# Patient Record
Sex: Male | Born: 1985 | Race: White | Hispanic: No | Marital: Married | Smoking: Former smoker
Health system: Southern US, Community
[De-identification: ages and names within clinical notes are randomized; demographics above are authoritative.]

## PROBLEM LIST (undated history)

## (undated) DIAGNOSIS — M459 Ankylosing spondylitis of unspecified sites in spine: Secondary | ICD-10-CM

## (undated) HISTORY — DX: Ankylosing spondylitis of unspecified sites in spine: M45.9

## (undated) HISTORY — PX: EYE SURGERY: SHX253

---

## 2011-06-03 ENCOUNTER — Emergency Department (INDEPENDENT_AMBULATORY_CARE_PROVIDER_SITE_OTHER)
Admission: EM | Admit: 2011-06-03 | Discharge: 2011-06-03 | Disposition: A | Discharge status: Home / Self Care | Payer: BC Managed Care – HMO | Source: Home / Self Care | Attending: Emergency Medicine | Admitting: Emergency Medicine

## 2011-06-03 ENCOUNTER — Encounter (HOSPITAL_COMMUNITY): Payer: Self-pay | Admitting: *Deleted

## 2011-06-03 DIAGNOSIS — H119 Unspecified disorder of conjunctiva: Secondary | ICD-10-CM

## 2011-06-03 MED ORDER — TOBRAMYCIN 0.3 % OP SOLN: 1.0000 [drp] | Freq: Four times a day (QID) | OPHTHALMIC | Status: AC

## 2011-06-03 NOTE — ED Provider Notes (Signed)
History     CSN: [id number]  Arrival date & time 06/03/11  4098   First MD Initiated Contact with Patient 06/03/11 [phone]      Chief Complaint  Patient presents with  . Eye Problem    (Consider location/radiation/quality/duration/timing/severity/associated sxs/prior treatment) HPI Comments: Patient presents urgent care with ongoing left eye redness and discomfort describes as "gritty sensation"patient denies that have had any injuries or objects injuring her affecting his high. Some mild decreased hearing and some degree of discomfort feels like he has and in his eye. No changes in vision. Does describe has been having some coexistent upper respiratory symptoms such as mild cough and runny and congested nose. No fevers, no shortness of breath.  Patient is a 26 y.o. male presenting with eye problem. The history is provided by the patient.  Eye Problem  This is a recurrent problem. The current episode started more than 2 days ago. The problem occurs constantly. The problem has not changed since onset.There is pain in the left eye. There was no injury mechanism. The pain is at a severity of 1/10. The pain is mild. There is no history of trauma to the eye. There is no known exposure to pink eye. He does not wear contacts. Associated symptoms include foreign body sensation and eye redness. Pertinent negatives include no numbness, no blurred vision, no decreased vision, no discharge, no photophobia, no nausea, no vomiting, no tingling, no weakness and no itching. He has tried nothing for the symptoms. The treatment provided no relief.    History reviewed. No pertinent past medical history.  Past Surgical History  Procedure Date  . Eye surgery     History reviewed. No pertinent family history.  History  Substance Use Topics  . Smoking status: Current Everyday Smoker  . Smokeless tobacco: Not on file  . Alcohol Use: Yes     socially      Review of Systems  Constitutional: Negative for  fever, chills, activity change and appetite change.  HENT: Positive for congestion and rhinorrhea.   Eyes: Positive for redness and itching. Negative for blurred vision, photophobia, discharge and visual disturbance.  Respiratory: Positive for cough. Negative for shortness of breath.   Gastrointestinal: Negative for nausea and vomiting.  Skin: Negative for itching.  Neurological: Negative for tingling, weakness and numbness.    Allergies  Review of patient's allergies indicates no known allergies.  Home Medications   Current Outpatient Rx  Name Route Sig Dispense Refill  . TOBRAMYCIN SULFATE 0.3 % OP SOLN Left Eye Place 1 drop into the left eye every 6 (six) hours. 5 mL 0    BP 109/63  Pulse 85  Temp(Src) 98 F (36.7 C) (Oral)  Resp 12  SpO2 100%  Physical Exam  Nursing note and vitals reviewed. Constitutional: He appears well-developed and well-nourished. No distress.  HENT:  Head: Normocephalic.  Eyes: EOM and lids are normal. Pupils are equal, round, and reactive to light. No foreign bodies found. Right eye exhibits no discharge. Left eye exhibits no discharge and no exudate. No foreign body present in the left eye. Left conjunctiva is injected. Left conjunctiva has no hemorrhage. Left eye exhibits normal extraocular motion and no nystagmus.  Lymphadenopathy:    He has no cervical adenopathy.  Skin: No rash noted.    ED Course  Procedures (including critical care time)  Labs Reviewed - No data to display No results found.   1. Conjunctival disease       MDM  No visual changes with moderate conjunctival hyperemia with 3 days. Patient denies any injury or brain objects affecting his left eye. No nausea, no headache no further symptoms. Patient has also been expressing some upper respiratory symptoms such as nasal congestion and mild cough the course of the week prior to left conjunctiva red        Jimmie Molly, MD 06/03/11 1325

## 2011-06-03 NOTE — ED Notes (Signed)
Pt  Reports  Pain /  Irritation         Of left  Eye  symptoms  X 3  Days  - pt  Reports  Sensation of  Something in the  Eye  But  denys  Any specefic  Occurrence        He  Is  Awake  As  Well as  Alert and  Oriented his  Skin is  Warm  /  Dry  He  denys any  Other  Symptoms

## 2011-06-03 NOTE — Discharge Instructions (Signed)
As. discuss should note marked improvement after 42 10/08/2010 return if no improvement or worsening or different symptoms

## 2011-06-04 ENCOUNTER — Encounter (HOSPITAL_COMMUNITY): Payer: Self-pay

## 2011-06-04 ENCOUNTER — Emergency Department (INDEPENDENT_AMBULATORY_CARE_PROVIDER_SITE_OTHER)
Admission: EM | Admit: 2011-06-04 | Discharge: 2011-06-04 | Disposition: A | Discharge status: Home / Self Care | Payer: BC Managed Care – HMO | Source: Home / Self Care | Attending: Family Medicine | Admitting: Family Medicine

## 2011-06-04 DIAGNOSIS — H109 Unspecified conjunctivitis: Secondary | ICD-10-CM

## 2011-06-04 MED ORDER — PREDNISOLONE ACETATE 0.12 % OP SUSP: 1.0000 [drp] | Freq: Four times a day (QID) | OPHTHALMIC | Status: AC

## 2011-06-04 MED ORDER — TETRACAINE HCL 0.5 % OP SOLN
OPHTHALMIC | Status: AC
  Filled 2011-06-04: qty 2

## 2011-06-04 NOTE — Discharge Instructions (Signed)
Use eye drops as prescribed and see eye doctor on fri if further problems.

## 2011-06-04 NOTE — ED Notes (Signed)
Denies trauma; c/o feels worse, tears MUCH worse when lays down

## 2011-06-04 NOTE — ED Provider Notes (Signed)
History     CSN: [id number]  Arrival date & time 06/04/11  1120   First MD Initiated Contact with Patient 06/04/11 1220      Chief Complaint  Patient presents with  . Eye Problem    (Consider location/radiation/quality/duration/timing/severity/associated sxs/prior treatment) Patient is a 26 y.o. male presenting with eye problem. The history is provided by the patient.  Eye Problem  This is a new problem. The current episode started more than 2 days ago (seen yest , sx seem to be worse with gtts given.). The problem has been gradually worsening. There is pain in the left eye. There was no injury mechanism (pt feels may have been from working in dusty barn on sat). The pain is mild. There is no history of trauma to the eye. There is no known exposure to pink eye. Associated symptoms include eye redness. Pertinent negatives include no decreased vision and no double vision.    History reviewed. No pertinent past medical history.  Past Surgical History  Procedure Date  . Eye surgery     History reviewed. No pertinent family history.  History  Substance Use Topics  . Smoking status: Current Everyday Smoker  . Smokeless tobacco: Not on file  . Alcohol Use: Yes     socially      Review of Systems  Constitutional: Negative.   Eyes: Positive for redness. Negative for double vision.    Allergies  Review of patient's allergies indicates no known allergies.  Home Medications   Current Outpatient Rx  Name Route Sig Dispense Refill  . PREDNISOLONE ACETATE 0.12 % OP SUSP Left Eye Place 1 drop into the left eye 4 (four) times daily. 5 mL 0  . TOBRAMYCIN SULFATE 0.3 % OP SOLN Left Eye Place 1 drop into the left eye every 6 (six) hours. 5 mL 0    BP 113/73  Pulse 58  Temp(Src) 97.9 F (36.6 C) (Oral)  Resp 17  SpO2 98%  Physical Exam  Nursing note and vitals reviewed. Constitutional: He is oriented to person, place, and time. He appears well-developed and well-nourished.    HENT:  Head: Normocephalic.  Eyes: EOM are normal. Pupils are equal, round, and reactive to light. Right eye exhibits no discharge. Left eye exhibits no discharge.       Mild conjunctival hyperemia on left, no fb or corneal abrasion, lids everted.  Neurological: He is alert and oriented to person, place, and time.  Skin: Skin is warm and dry.    ED Course  Procedures (including critical care time)  Labs Reviewed - No data to display No results found.   1. Conjunctivitis of left eye       MDM  fluroscein stain neg, slit lamp exam neg for fb or abrasion, eyelids everted wiped and eye irrigated, bacitracin applied.        Linna Hoff, MD 06/04/11 1400

## 2011-06-23 ENCOUNTER — Emergency Department (HOSPITAL_COMMUNITY)
Admission: EM | Admit: 2011-06-23 | Discharge: 2011-06-23 | Disposition: A | Discharge status: Home / Self Care | Payer: BC Managed Care – HMO | Source: Home / Self Care | Attending: Emergency Medicine | Admitting: Emergency Medicine

## 2011-06-23 ENCOUNTER — Encounter (HOSPITAL_COMMUNITY): Payer: Self-pay

## 2011-06-23 DIAGNOSIS — H11432 Conjunctival hyperemia, left eye: Secondary | ICD-10-CM

## 2011-06-23 DIAGNOSIS — H11439 Conjunctival hyperemia, unspecified eye: Secondary | ICD-10-CM

## 2011-06-23 NOTE — ED Notes (Signed)
C/o redness and irritation to lt eye.  States sx started 06/07/11 and he has been here x 2 for the same..  Reports sx are getting worse and vision is blurry in lt eye.

## 2011-06-23 NOTE — Discharge Instructions (Signed)
The ophthalmologist will see you in my eye 10:45
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
## 2014-07-07 ENCOUNTER — Ambulatory Visit
Admission: RE | Admit: 2014-07-07 | Discharge: 2014-07-07 | Disposition: A | Discharge status: Home / Self Care | Payer: PRIVATE HEALTH INSURANCE | Source: Ambulatory Visit | Attending: Family Medicine | Admitting: Family Medicine

## 2014-07-07 ENCOUNTER — Other Ambulatory Visit: Payer: Self-pay | Admitting: Family Medicine

## 2014-07-07 DIAGNOSIS — I861 Scrotal varices: Secondary | ICD-10-CM | POA: Insufficient documentation

## 2014-07-07 DIAGNOSIS — N509 Disorder of male genital organs, unspecified: Principal | ICD-10-CM

## 2014-07-07 DIAGNOSIS — N50819 Testicular pain, unspecified: Secondary | ICD-10-CM

## 2014-07-07 DIAGNOSIS — N508 Other specified disorders of male genital organs: Secondary | ICD-10-CM | POA: Diagnosis present

## 2014-07-07 DIAGNOSIS — N452 Orchitis: Secondary | ICD-10-CM | POA: Diagnosis not present

## 2014-08-09 ENCOUNTER — Ambulatory Visit: Payer: PRIVATE HEALTH INSURANCE

## 2014-08-09 ENCOUNTER — Other Ambulatory Visit: Payer: Self-pay | Admitting: Family Medicine

## 2014-08-09 ENCOUNTER — Other Ambulatory Visit: Payer: Self-pay

## 2014-08-09 DIAGNOSIS — N509 Disorder of male genital organs, unspecified: Principal | ICD-10-CM

## 2014-08-09 DIAGNOSIS — N50819 Testicular pain, unspecified: Secondary | ICD-10-CM

## 2016-05-01 IMAGING — US US ART/VEN ABD/PELV/SCROTUM DOPPLER LTD
1 series · 13 of 25 positions shown · non-contrast
Comparison: None.

CLINICAL DATA: Right scrotal region pain and tenderness

EXAM:
SCROTAL ULTRASOUND
DOPPLER ULTRASOUND OF THE TESTICLES
TECHNIQUE: Complete ultrasound examination of the testicles, epididymis, and
other scrotal structures was performed. Color and spectral Doppler
ultrasound were also utilized to evaluate blood flow to the
testicles.

[Series 1: us art/ven abd/pelv/scrotum doppler ltd · 0.08mm/px · 13 of 105 slices shown]
[im 1/105]
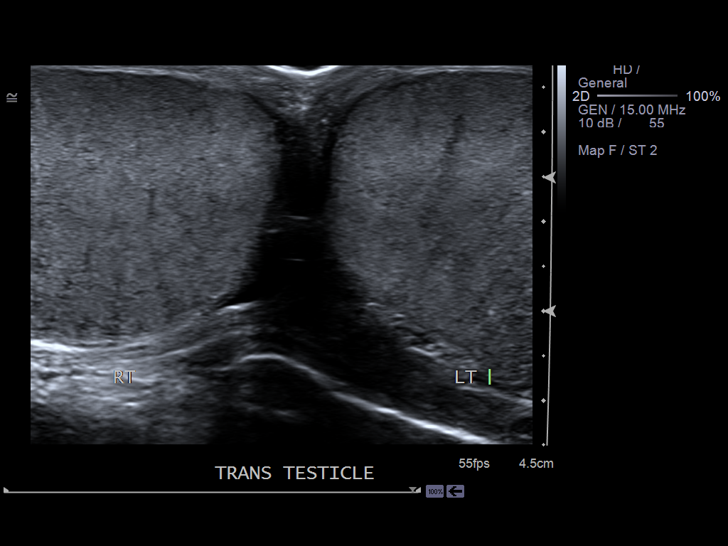
[im 9/105]
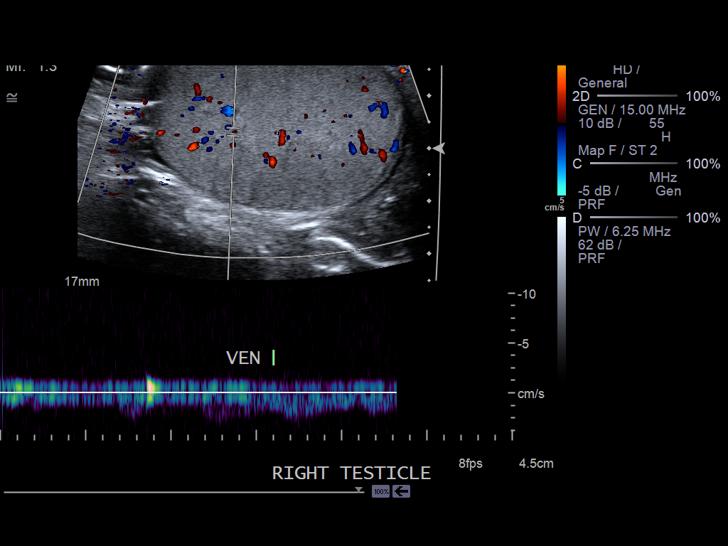
[im 18/105]
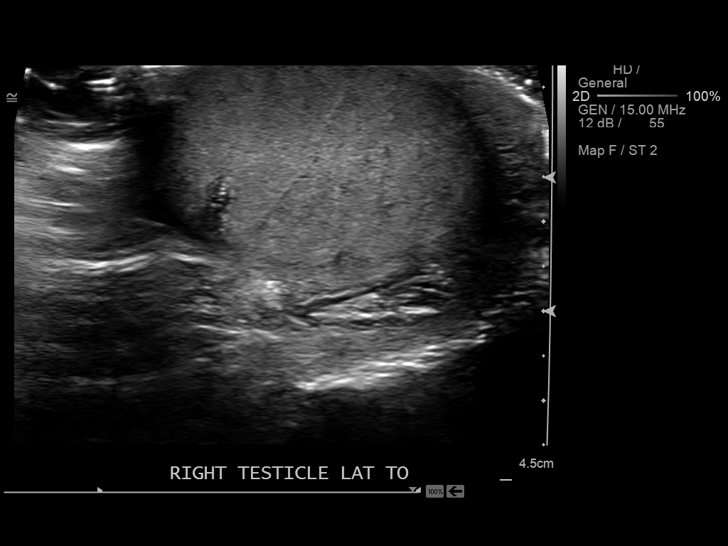
[im 27/105]
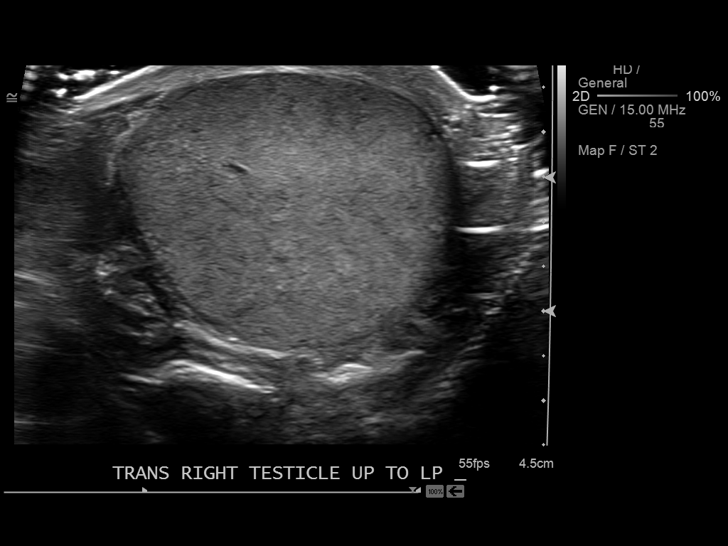
[im 35/105]
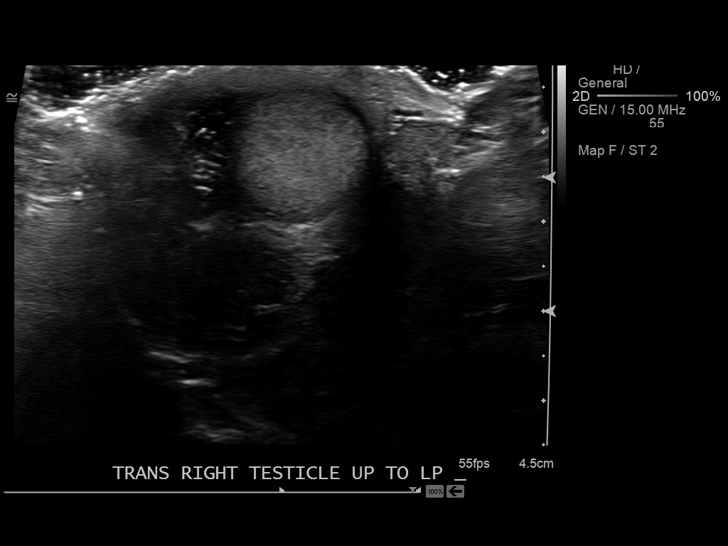
[im 44/105]
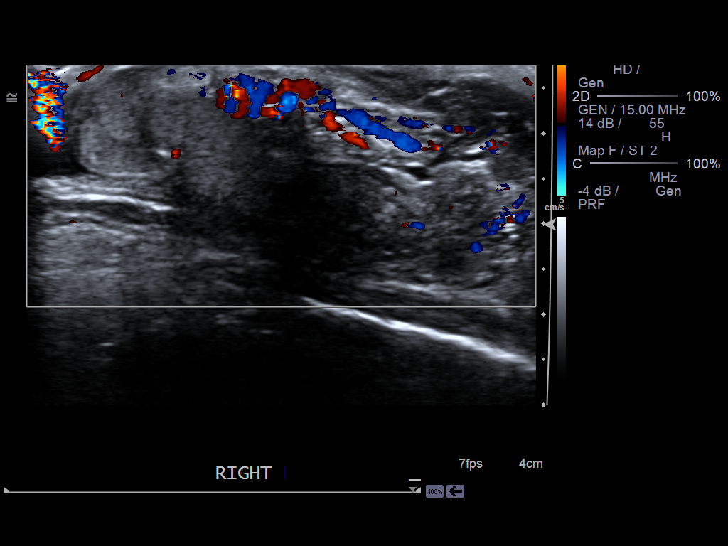
[im 53/105]
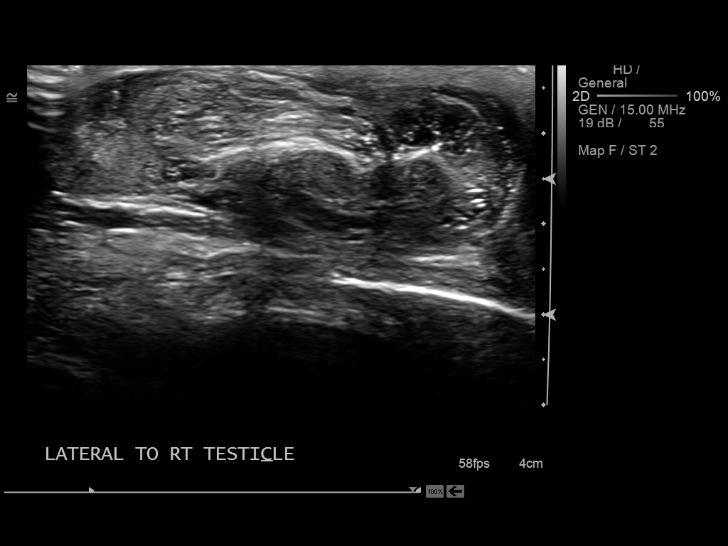
[im 61/105]
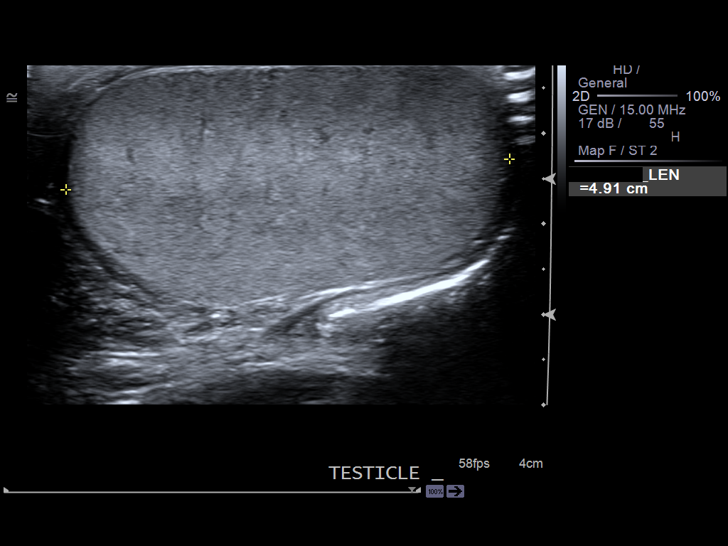
[im 70/105]
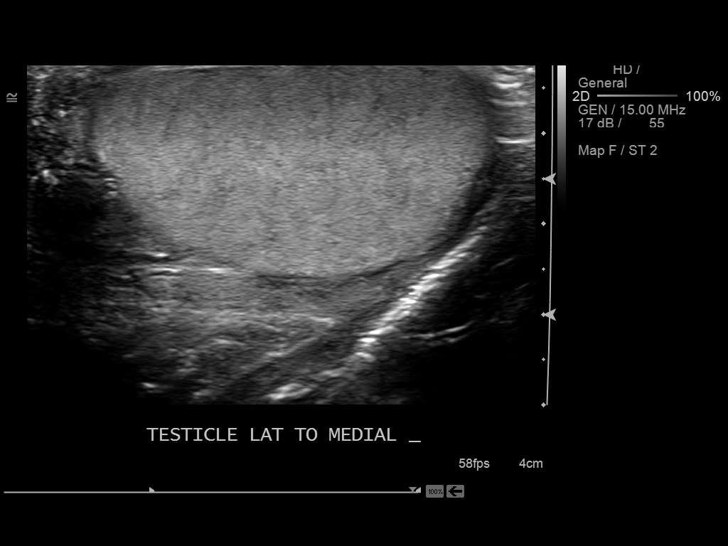
[im 79/105]
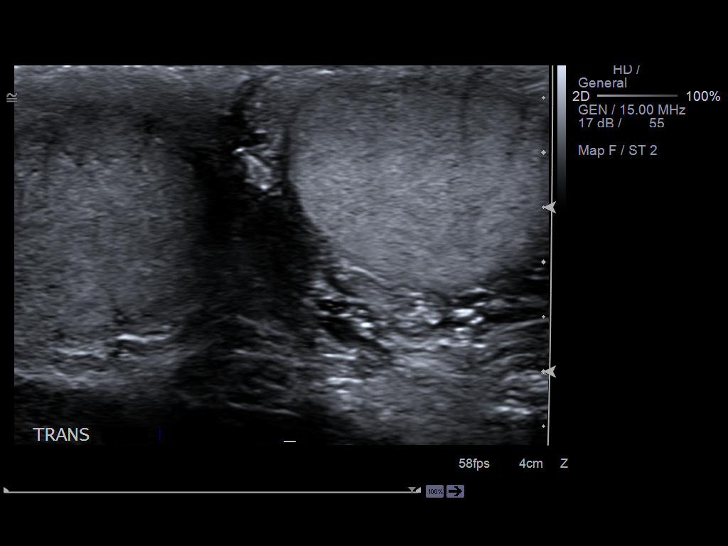
[im 87/105]
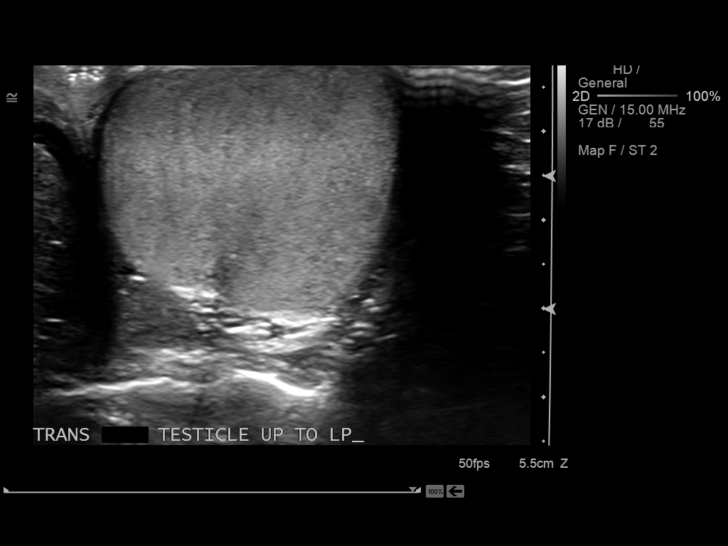
[im 96/105]
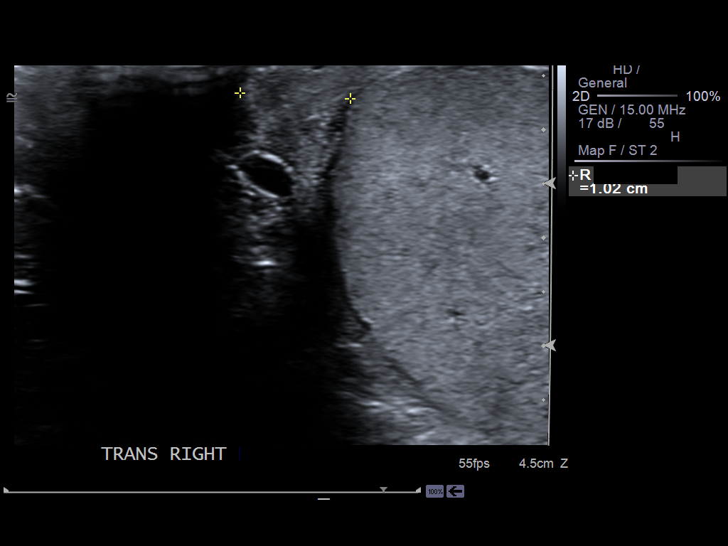
[im 105/105]
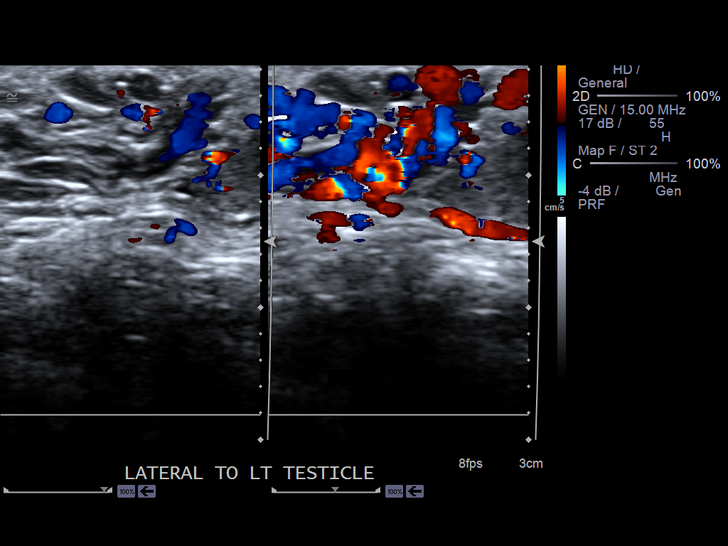

[13 of 25 positions shown; findings below may reference images not displayed]

FINDINGS: Right testicle

Measurements: 4.9 x 3.1 x 3.7. No mass or microlithiasis visualized.

Left testicle

Measurements: 4.9 x 2.9 x 3.3 cm. No mass or microlithiasis
visualized.

Right epididymis: The right epididymis is enlarged and mildly
edematous. There is increased vascularity in the right epididymis,
primarily in the head and body regions.

Left epididymis: Normal in size and contour. No hypervascularity.
Several 2-3 mm cysts are noted in the head of the epididymis on the
left.

Hydrocele:  None visualized.

Varicocele: Small bilateral varicoceles are noted with Valsalva
maneuver.

Pulsed Doppler interrogation of both testes demonstrates normal low
resistance arterial and venous waveforms bilaterally. Peak systolic
velocity in the left testis is 7 cm/sec. Peak systolic velocity in
the right testis is 8 cm/sec.

No scrotal abscess or wall thickening seen on either side.
IMPRESSION: Right-sided orchitis.

No intratesticular or extratesticular mass. No testicular torsion on
either side.

Small varicoceles bilaterally.

Several tiny epididymal head cysts on the left.

## 2016-06-16 ENCOUNTER — Ambulatory Visit: Payer: Self-pay | Admitting: Physician Assistant

## 2016-06-16 ENCOUNTER — Ambulatory Visit
Admission: RE | Admit: 2016-06-16 | Discharge: 2016-06-16 | Disposition: A | Discharge status: Home / Self Care | Payer: Managed Care, Other (non HMO) | Source: Ambulatory Visit | Attending: Physician Assistant | Admitting: Physician Assistant

## 2016-06-16 ENCOUNTER — Encounter: Payer: Self-pay | Admitting: Physician Assistant

## 2016-06-16 VITALS — BP 130/79 | HR 76 | Temp 97.7°F

## 2016-06-16 DIAGNOSIS — R935 Abnormal findings on diagnostic imaging of other abdominal regions, including retroperitoneum: Secondary | ICD-10-CM | POA: Insufficient documentation

## 2016-06-16 DIAGNOSIS — S3600XA Unspecified injury of spleen, initial encounter: Secondary | ICD-10-CM

## 2016-06-16 DIAGNOSIS — R1012 Left upper quadrant pain: Secondary | ICD-10-CM

## 2016-06-16 DIAGNOSIS — S2232XA Fracture of one rib, left side, initial encounter for closed fracture: Secondary | ICD-10-CM

## 2016-06-16 MED ORDER — TRAMADOL HCL 50 MG PO TABS: 50.0000 mg | ORAL_TABLET | Freq: Three times a day (TID) | ORAL | 0 refills | Status: DC | PRN

## 2016-06-16 NOTE — Addendum Note (Signed)
Addended by: Faythe Ghee on: 06/16/2016 11:41 AM   Modules accepted: Orders
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
## 2016-06-16 NOTE — Patient Instructions (Addendum)
Blunt Abdominal Trauma  Blunt abdominal trauma is a type of injury that involves damage to the abdominal wall or to abdominal organs, such as the liver or spleen. The damage can involve bruising, tearing, or a rupture. This type of injury does not involve a puncture of the skin.  Blunt abdominal trauma can range from mild to severe. In some cases it can lead to a severe abdominal inflammation (peritonitis), severe bleeding, and a dangerous drop in blood pressure.  What are the causes?  This injury is caused by a hard, direct hit to the abdomen. It can happen after:  · A motor vehicle accident.  · Being kicked or punched in the abdomen.  · Falling from a significant height.    What increases the risk?  This injury is more likely to happen in people who:  · Play contact sports.  · Work in a job in which falls or injuries are more likely, such as in construction.    What are the signs or symptoms?  The main symptom of this condition is pain in the abdomen. Other symptoms depend on the type and location of the injury. They can include:  · Abdominal pain that spreads to the the back or shoulder.  · Bruising.  · Swelling.  · Pain when pressing on the abdomen.  · Blood in the urine.  · Weakness.  · Confusion.  · Loss of consciousness.  · Pale, dusky, cool, or sweaty skin.  · Vomiting blood.  · Bloody stool or bleeding from the rectum.  · Trouble breathing.    Symptoms of this injury can develop suddenly or slowly.  How is this diagnosed?  This injury is diagnosed based on your symptoms and a physical exam. You may also have tests, including:  · Blood tests.  · Urine tests.  · Imaging tests, such as:  ? A CT scan and ultrasound of your abdomen.  ? X-rays of your chest and abdomen.  · A test in which a tube is used to flush your abdomen with fluid and check for blood (diagnostic peritoneal lavage).    How is this treated?  Treatment for this injury depends on its type and severity. Treatment options include:  · Observation.  If the injury is mild, this may be the only treatment needed.  · Support of your blood pressure and breathing.  · Getting blood, fluids, or medicine through an IV tube.  · Antibiotic medicine.  · Insertion of tubes into the stomach or bladder.  · A blood transfusion.  · A procedure to stop bleeding. This involves putting a long, thin tube (catheter) into one of your blood vessels (angiographic embolization).  · Surgery to open up your abdomen and control bleeding or repair damage (laparotomy). This may be done if tests suggest that you have peritonitis or bleeding that cannot be controlled with angiographic embolization.    Follow these instructions at home:  · Take medicines only as directed by your health care provider.  · If you were prescribed an antibiotic medicine, finish all of it even if you start to feel better.  · Follow your health care provider's instructions about diet and activity restrictions.  · Keep all follow-up visits as directed by your health care provider. This is important.  Contact a health care provider if:  · You continue to have abdominal pain.  · Your symptoms return.  · You develop new symptoms.  · You have blood in your urine or your bowel   movements.  Get help right away if:  · You vomit blood.  · You have heavy bleeding from your rectum.  · You have very bad abdominal pain.  · You have trouble breathing.  · You have chest pain.  · You have a fever.  · You have dizziness.  · You pass out.  This information is not intended to replace advice given to you by your health care provider. Make sure you discuss any questions you have with your health care provider.  Document Released: 03/27/2004 Document Revised: 06/28/2015 Document Reviewed: 02/08/2014  Elsevier Interactive Patient Education © 2017 [employer]

## 2016-07-21 ENCOUNTER — Ambulatory Visit: Payer: Self-pay | Admitting: Physician Assistant

## 2016-07-21 ENCOUNTER — Encounter: Payer: Self-pay | Admitting: Physician Assistant

## 2016-07-21 VITALS — BP 120/70 | HR 63 | Temp 98.5°F | Ht 71.0 in | Wt 149.0 lb

## 2016-07-21 DIAGNOSIS — Z0189 Encounter for other specified special examinations: Secondary | ICD-10-CM

## 2016-07-21 DIAGNOSIS — Z0000 Encounter for general adult medical examination without abnormal findings: Secondary | ICD-10-CM

## 2016-07-21 DIAGNOSIS — Z008 Encounter for other general examination: Secondary | ICD-10-CM

## 2016-07-21 NOTE — Progress Notes (Signed)
S: pt here for wellness physical and biometrics for insurance purposes, no complaints ros neg. PMH: ankylosing spondylitis,    Social: former smoker, +dips snuff, + etoh, no drugs Fam: AAA, MS, Guillan-Barre syndrome, diabetes and htn in elderly family members  O: vitals wnl, nad, ENT wnl, neck supple no lymph, lungs c t a, cv rrr, abd soft nontender bs normal all 4 quads  A: wellness, biometric physical  P: labs, f/u prn, discussed tobacco use and etoh use

## 2016-07-22 LAB — CMP12+LP+TP+TSH+6AC+CBC/D/PLT
A/G RATIO: 1.8 (ref 1.2–2.2)
ALK PHOS: 61 IU/L (ref 39–117)
ALT: 14 IU/L (ref 0–44)
AST: 20 IU/L (ref 0–40)
Albumin: 4.8 g/dL (ref 3.5–5.5)
BASOS ABS: 0 10*3/uL (ref 0.0–0.2)
BASOS: 1 %
BILIRUBIN TOTAL: 0.3 mg/dL (ref 0.0–1.2)
BUN / CREAT RATIO: 10 (ref 9–20)
BUN: 9 mg/dL (ref 6–20)
CHLORIDE: 100 mmol/L (ref 96–106)
Calcium: 9.5 mg/dL (ref 8.7–10.2)
Chol/HDL Ratio: 3.6 ratio (ref 0.0–5.0)
Cholesterol, Total: 167 mg/dL (ref 100–199)
Creatinine, Ser: 0.86 mg/dL (ref 0.76–1.27)
EOS (ABSOLUTE): 0.3 10*3/uL (ref 0.0–0.4)
EOS: 7 %
Estimated CHD Risk: 0.6 times avg. (ref 0.0–1.0)
Free Thyroxine Index: 1.8 (ref 1.2–4.9)
GFR calc non Af Amer: 118 mL/min/{1.73_m2} (ref >59)
GFR, EST AFRICAN AMERICAN: 136 mL/min/{1.73_m2} (ref >59)
GGT: 19 IU/L (ref 0–65)
Globulin, Total: 2.6 g/dL (ref 1.5–4.5)
Glucose: 83 mg/dL (ref 65–99)
HDL: 47 mg/dL (ref >39)
HEMATOCRIT: 39.3 % (ref 37.5–51.0)
HEMOGLOBIN: 13 g/dL (ref 13.0–17.7)
IMMATURE GRANS (ABS): 0 10*3/uL (ref 0.0–0.1)
IMMATURE GRANULOCYTES: 0 %
IRON: 48 ug/dL (ref 38–169)
LDH: 205 IU/L (ref 121–224)
LDL CALC: 103 mg/dL — AB (ref 0–99)
Lymphocytes Absolute: 1.6 10*3/uL (ref 0.7–3.1)
Lymphs: 35 %
MCH: 28.5 pg (ref 26.6–33.0)
MCHC: 33.1 g/dL (ref 31.5–35.7)
MCV: 86 fL (ref 79–97)
MONOCYTES: 10 %
Monocytes Absolute: 0.4 10*3/uL (ref 0.1–0.9)
NEUTROS PCT: 47 %
Neutrophils Absolute: 2.2 10*3/uL (ref 1.4–7.0)
Phosphorus: 3.1 mg/dL (ref 2.5–4.5)
Platelets: 281 10*3/uL (ref 150–379)
Potassium: 4.3 mmol/L (ref 3.5–5.2)
RBC: 4.56 x10E6/uL (ref 4.14–5.80)
RDW: 13.5 % (ref 12.3–15.4)
Sodium: 141 mmol/L (ref 134–144)
T3 UPTAKE RATIO: 28 % (ref 24–39)
T4, Total: 6.6 ug/dL (ref 4.5–12.0)
TOTAL PROTEIN: 7.4 g/dL (ref 6.0–8.5)
TSH: 1.77 u[IU]/mL (ref 0.450–4.500)
Triglycerides: 83 mg/dL (ref 0–149)
Uric Acid: 5.4 mg/dL (ref 3.7–8.6)
VLDL CHOLESTEROL CAL: 17 mg/dL (ref 5–40)
WBC: 4.5 10*3/uL (ref 3.4–10.8)

## 2016-07-22 LAB — HEPATITIS C ANTIBODY (REFLEX): HCV Ab: <0.1 s/co ratio (ref 0.0–0.9)

## 2016-07-22 LAB — HIV ANTIBODY (ROUTINE TESTING W REFLEX): HIV Screen 4th Generation wRfx: NONREACTIVE

## 2016-07-22 LAB — HCV COMMENT:

## 2016-07-22 LAB — VITAMIN D 25 HYDROXY (VIT D DEFICIENCY, FRACTURES): Vit D, 25-Hydroxy: 35.4 ng/mL (ref 30.0–100.0)

## 2017-04-23 ENCOUNTER — Encounter: Payer: Self-pay | Admitting: Family Medicine

## 2017-04-23 ENCOUNTER — Ambulatory Visit: Payer: Self-pay | Admitting: Family Medicine

## 2017-04-23 VITALS — BP 112/76 | HR 76 | Temp 97.7°F | Resp 18 | Ht 71.0 in | Wt 155.0 lb

## 2017-04-23 DIAGNOSIS — Z299 Encounter for prophylactic measures, unspecified: Secondary | ICD-10-CM

## 2017-04-23 NOTE — Progress Notes (Signed)
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  He denies any complaints at this time and reports that his only medical history is ankylosing spondylitis, which is well controlled with Humira, sees Rheumatology regularly. Family history includes: AAA, MS, Guillan-Barre syndrome, diabetes and htn in elderly family members.  No functional limitations identified.  ROS Constitutional: No weight change, no fever, no chills, no fatigue, no malaise.  Musculoskeletal: No arthralgias, myalgias, joint swelling, joint stiffness, back pain, neck pain.  ROS otherwise negative.      Objective:   Physical Exam General: Awake, alert and oriented. No acute distress. Well developed, hydrated and nourished. Appears stated age.  HEENT: Supple neck without adenopathy. Sclera is non-icteric. The ear canal is clear without discharge. The tympanic membrane is normal in appearance with normal landmarks and cone of light. Nasal mucosa is pink and moist. Oral mucosa is pink and moist with good dentition. The pharynx is normal in appearance without tonsillar swelling or exudates.  Skin: Skin in warm, dry and intact without rashes or lesions. Appropriate color for ethnicity. Nailbeds pink with no cyanosis or clubbing. Cardiac:Heart rate and rhythm are normal. No murmurs, gallops, or rubs are auscultated. S1 and S2 are heard and are of normal intensity.  Respiratory: The chest wall is symmetric and without deformity. No signs of respiratory distress. Lung sounds are clear in all lobes bilaterally without rales, ronchi, or wheezes.  Abdominal: Abdomen is soft, symmetric, and non-tender without distention.  Umbilicus is midline without herniation. No masses, hepatomegaly, or splenomegaly are noted.  Spine: Neck and back are without deformity. Curvature of the cervical, thoracic, and lumbar spine are  within normal limits. Bony features of the shoulders and hips are of equal height bilaterally. Posture is upright, gait is smooth, steady, and within normal limits.  No tenderness noted on palpation of the spinous processes. Spinous processes are midline. Cervical, thoracic, and lumbar paraspinal muscles are not tender and are without spasm. No discomfort is noted with flexion, extension, and side-to-side rotation of the cervical spine, full range of motion is noted. Full range of motion including flexion, extension, and side-to-side rotation of the thoracic and lumbar spine are noted and without discomfort. Grip strength is normal bilaterally. Dorsi/plantar flexion is normal bilaterally.  Extremities: Upper and lower extremities are without tenderness or deformity. No swelling or erythema. Full range of motion is noted to all major joints. Steady gait noted.  Neurological: The patient is awake, alert and oriented to person, place, and time with normal speech. Memory is normal and thought process is intact. No gait abnormalities are appreciated.  Psychiatric: Appropriate mood and affect.  Assessment Unrestricted duty granted  Plan Labs pending. Continue regular care with rheumatology and primary care physician.

## 2017-04-24 LAB — CMP12+LP+TP+TSH+6AC+PSA+CBC…
ALBUMIN: 4.7 g/dL (ref 3.5–5.5)
ALT: 18 IU/L (ref 0–44)
AST: 23 IU/L (ref 0–40)
Albumin/Globulin Ratio: 1.7 (ref 1.2–2.2)
Alkaline Phosphatase: 74 IU/L (ref 39–117)
BASOS: 1 %
BUN/Creatinine Ratio: 10 (ref 9–20)
BUN: 11 mg/dL (ref 6–20)
Basophils Absolute: 0.1 10*3/uL (ref 0.0–0.2)
Bilirubin Total: 0.4 mg/dL (ref 0.0–1.2)
CHOL/HDL RATIO: 3.8 ratio (ref 0.0–5.0)
CHOLESTEROL TOTAL: 146 mg/dL (ref 100–199)
Calcium: 9.5 mg/dL (ref 8.7–10.2)
Chloride: 101 mmol/L (ref 96–106)
Creatinine, Ser: 1.05 mg/dL (ref 0.76–1.27)
EOS (ABSOLUTE): 0.4 10*3/uL (ref 0.0–0.4)
Eos: 8 %
Estimated CHD Risk: 0.7 times avg. (ref 0.0–1.0)
Free Thyroxine Index: 1.5 (ref 1.2–4.9)
GFR calc Af Amer: 110 mL/min/{1.73_m2} (ref >59)
GFR calc non Af Amer: 95 mL/min/{1.73_m2} (ref >59)
GGT: 15 IU/L (ref 0–65)
GLOBULIN, TOTAL: 2.7 g/dL (ref 1.5–4.5)
Glucose: 92 mg/dL (ref 65–99)
HDL: 38 mg/dL — ABNORMAL LOW (ref >39)
HEMATOCRIT: 37.6 % (ref 37.5–51.0)
Hemoglobin: 12.8 g/dL — ABNORMAL LOW (ref 13.0–17.7)
IMMATURE GRANULOCYTES: 0 %
IRON: 96 ug/dL (ref 38–169)
Immature Grans (Abs): 0 10*3/uL (ref 0.0–0.1)
LDH: 132 IU/L (ref 121–224)
LDL Calculated: 97 mg/dL (ref 0–99)
LYMPHS ABS: 1.6 10*3/uL (ref 0.7–3.1)
LYMPHS: 33 %
MCH: 29 pg (ref 26.6–33.0)
MCHC: 34 g/dL (ref 31.5–35.7)
MCV: 85 fL (ref 79–97)
MONOS ABS: 0.4 10*3/uL (ref 0.1–0.9)
Monocytes: 9 %
Neutrophils Absolute: 2.3 10*3/uL (ref 1.4–7.0)
Neutrophils: 49 %
PHOSPHORUS: 3.3 mg/dL (ref 2.5–4.5)
POTASSIUM: 4.5 mmol/L (ref 3.5–5.2)
PROSTATE SPECIFIC AG, SERUM: 0.6 ng/mL (ref 0.0–4.0)
Platelets: 263 10*3/uL (ref 150–379)
RBC: 4.42 x10E6/uL (ref 4.14–5.80)
RDW: 13.5 % (ref 12.3–15.4)
SODIUM: 140 mmol/L (ref 134–144)
T3 UPTAKE RATIO: 25 % (ref 24–39)
T4 TOTAL: 6 ug/dL (ref 4.5–12.0)
TOTAL PROTEIN: 7.4 g/dL (ref 6.0–8.5)
TRIGLYCERIDES: 56 mg/dL (ref 0–149)
TSH: 1.24 u[IU]/mL (ref 0.450–4.500)
Uric Acid: 6.3 mg/dL (ref 3.7–8.6)
VLDL Cholesterol Cal: 11 mg/dL (ref 5–40)
WBC: 4.7 10*3/uL (ref 3.4–10.8)
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
## 2021-06-03 ENCOUNTER — Emergency Department: Payer: BC Managed Care – PPO | Admitting: Anesthesiology

## 2021-06-03 ENCOUNTER — Ambulatory Visit
Admission: EM | Admit: 2021-06-03 | Discharge: 2021-06-03 | Disposition: A | Discharge status: Home / Self Care | Payer: BC Managed Care – PPO | Attending: Emergency Medicine | Admitting: Emergency Medicine

## 2021-06-03 ENCOUNTER — Other Ambulatory Visit: Payer: Self-pay

## 2021-06-03 ENCOUNTER — Encounter
Admission: EM | Disposition: A | Discharge status: Home / Self Care | Payer: Self-pay | Source: Home / Self Care | Attending: Emergency Medicine

## 2021-06-03 DIAGNOSIS — X58XXXA Exposure to other specified factors, initial encounter: Secondary | ICD-10-CM | POA: Insufficient documentation

## 2021-06-03 DIAGNOSIS — Z20822 Contact with and (suspected) exposure to covid-19: Secondary | ICD-10-CM | POA: Diagnosis not present

## 2021-06-03 DIAGNOSIS — M459 Ankylosing spondylitis of unspecified sites in spine: Secondary | ICD-10-CM | POA: Diagnosis not present

## 2021-06-03 DIAGNOSIS — T18128A Food in esophagus causing other injury, initial encounter: Secondary | ICD-10-CM

## 2021-06-03 DIAGNOSIS — Z79620 Long term (current) use of immunosuppressive biologic: Secondary | ICD-10-CM | POA: Diagnosis not present

## 2021-06-03 DIAGNOSIS — K229 Disease of esophagus, unspecified: Secondary | ICD-10-CM | POA: Diagnosis not present

## 2021-06-03 DIAGNOSIS — Z87891 Personal history of nicotine dependence: Secondary | ICD-10-CM | POA: Diagnosis not present

## 2021-06-03 DIAGNOSIS — K222 Esophageal obstruction: Secondary | ICD-10-CM

## 2021-06-03 HISTORY — PX: ESOPHAGOGASTRODUODENOSCOPY: SHX5428

## 2021-06-03 LAB — RESP PANEL BY RT-PCR (FLU A&B, COVID) ARPGX2
Influenza A by PCR: NEGATIVE
Influenza B by PCR: NEGATIVE
SARS Coronavirus 2 by RT PCR: NEGATIVE

## 2021-06-03 SURGERY — EGD (ESOPHAGOGASTRODUODENOSCOPY)
Anesthesia: General

## 2021-06-03 MED ORDER — GLUCAGON HCL RDNA (DIAGNOSTIC) 1 MG IJ SOLR
1.0000 mg | Freq: Once | INTRAMUSCULAR | Status: AC
  Administered 2021-06-03: 1 mg via INTRAVENOUS
  Filled 2021-06-03: qty 1

## 2021-06-03 MED ORDER — SUCCINYLCHOLINE CHLORIDE 200 MG/10ML IV SOSY
PREFILLED_SYRINGE | INTRAVENOUS | Status: DC | PRN
  Administered 2021-06-03: 150 mg via INTRAVENOUS

## 2021-06-03 MED ORDER — PROPOFOL 10 MG/ML IV BOLUS
INTRAVENOUS | Status: AC
  Filled 2021-06-03: qty 20

## 2021-06-03 MED ORDER — PROPOFOL 10 MG/ML IV BOLUS
INTRAVENOUS | Status: DC | PRN
  Administered 2021-06-03: 150 mg via INTRAVENOUS

## 2021-06-03 MED ORDER — MIDAZOLAM HCL 2 MG/2ML IJ SOLN
INTRAMUSCULAR | Status: AC
  Filled 2021-06-03: qty 2

## 2021-06-03 MED ORDER — PHENYLEPHRINE HCL-NACL 20-0.9 MG/250ML-% IV SOLN
INTRAVENOUS | Status: AC
  Filled 2021-06-03: qty 250

## 2021-06-03 MED ORDER — FENTANYL CITRATE PF 50 MCG/ML IJ SOSY: 25.0000 ug | PREFILLED_SYRINGE | INTRAMUSCULAR | Status: DC | PRN

## 2021-06-03 MED ORDER — SUCCINYLCHOLINE CHLORIDE 200 MG/10ML IV SOSY
PREFILLED_SYRINGE | INTRAVENOUS | Status: AC
  Filled 2021-06-03: qty 10

## 2021-06-03 MED ORDER — ONDANSETRON HCL 4 MG/2ML IJ SOLN
4.0000 mg | Freq: Once | INTRAMUSCULAR | Status: DC
  Filled 2021-06-03: qty 2

## 2021-06-03 MED ORDER — LACTATED RINGERS IV SOLN: INTRAVENOUS | Status: DC | PRN

## 2021-06-03 NOTE — Transfer of Care (Signed)
Immediate Anesthesia Transfer of Care Note ? ?Patient: Chad Short ? ?Procedure(s) Performed: ESOPHAGOGASTRODUODENOSCOPY (EGD) ? ?Patient Location: PACU ? ?Anesthesia Type:General ? ?Level of Consciousness: sedated and patient cooperative ? ?Airway & Oxygen Therapy: Patient Spontanous Breathing and Patient connected to face mask oxygen ? ?Post-op Assessment: Report given to RN and Post -op Vital signs reviewed and stable ? ?Post vital signs: Reviewed and stable ? ?Last Vitals:  ?Vitals Value Taken Time  ?BP    ?Temp    ?Pulse    ?Resp    ?SpO2    ? ? ?Last Pain:  ?Vitals:  ? 06/03/21 2157  ?TempSrc:   ?PainSc: 5   ?   ? ?  ? ?Complications: No notable events documented. ?

## 2021-06-03 NOTE — Op Note (Signed)
[facility] ?Gastroenterology ?Patient Name: Chad Short ?Procedure Date: 06/03/2021 9:41 PM ?MRN: [id number] ?Account #: [id number] ?Date of Birth: 28-Oct-1985 ?Admit Type: Outpatient ?Age: 36 ?[facility] ?Gender: Male ?Note Status: Finalized ?Instrument Name: Upper Endoscope [id number] ?Procedure:             Upper GI endoscopy ?Indications:           Foreign body in the esophagus ?Providers:             Eather Colasameron Uday Jantz MD, MD ?Referring MD:          Hassell HalimMarcus E. Babaoff MD (Referring MD) ?Medicines:             General Anesthesia ?Complications:         No immediate complications. Estimated blood loss:  ?                       Minimal. ?Procedure:             Pre-Anesthesia Assessment: ?                       - Prior to the procedure, a History and Physical was  ?                       performed, and patient medications and allergies were  ?                       reviewed. The patient is competent. The risks and  ?                       benefits of the procedure and the sedation options and  ?                       risks were discussed with the patient. All questions  ?                       were answered and informed consent was obtained.  ?                       Patient identification and proposed procedure were  ?                       verified by the physician, the nurse, the  ?                       anesthesiologist, the anesthetist and the technician  ?                       in the endoscopy suite. Mental Status Examination:  ?                       alert and oriented. Airway Examination: normal  ?                       oropharyngeal airway and neck mobility. Respiratory  ?                       Examination: clear to auscultation. CV Examination:  ?  normal. Prophylactic Antibiotics: The patient does not  ?                       require prophylactic antibiotics. Prior  ?                       Anticoagulants: The patient has taken no previous  ?                        anticoagulant or antiplatelet agents. ASA Grade  ?                       Assessment: E - Emergency. After reviewing the risks  ?                       and benefits, the patient was deemed in satisfactory  ?                       condition to undergo the procedure. The anesthesia  ?                       plan was to use general anesthesia. Immediately prior  ?                       to administration of medications, the patient was  ?                       re-assessed for adequacy to receive sedatives. The  ?                       heart rate, respiratory rate, oxygen saturations,  ?                       blood pressure, adequacy of pulmonary ventilation, and  ?                       response to care were monitored throughout the  ?                       procedure. The physical status of the patient was  ?                       re-assessed after the procedure. ?                       After obtaining informed consent, the endoscope was  ?                       passed under direct vision. Throughout the procedure,  ?                       the patient's blood pressure, pulse, and oxygen  ?                       saturations were monitored continuously. The Endoscope  ?                       was introduced through the mouth, and advanced to the  ?  second part of duodenum. The upper GI endoscopy was  ?                       accomplished without difficulty. The patient tolerated  ?                       the procedure well. ?Findings: ?     Food was found in the lower third of the esophagus. Removal of food was  ?     accomplished. Biopsies were obtained from the proximal and distal  ?     esophagus with cold forceps for histology of suspected eosinophilic  ?     esophagitis. Estimated blood loss was minimal. ?     A medium amount of food (residue) was found in the gastric body. ?     The exam of the stomach was otherwise normal. ?     The examined duodenum was normal. ?Impression:            -  Food in the lower third of the esophagus. Biopsied.  ?                       Removal was successful. ?                       - A medium amount of food (residue) in the stomach. ?                       - Normal examined duodenum. ?Recommendation:        - Discharge patient to home. ?                       - Resume previous diet. ?                       - Use a proton pump inhibitor PO daily. (Nexium or  ?                       Prilosec) ?                       - Await pathology results. ?                       - Repeat upper endoscopy in 12 weeks. ?                       - Return to GI clinic at appointment to be scheduled. ?Procedure Code(s):     --- Professional --- ?                       [phone], Esophagogastroduodenoscopy, flexible,  ?                       transoral; with removal of foreign body(s) ?                       [id number], Esophagogastroduodenoscopy, flexible,  ?                       transoral; with biopsy, single or multiple ?Diagnosis Code(s):     --- Professional --- ?  G25.427C, Food in esophagus causing other injury,  ?                       initial encounter ?                       T18.2XXA, Foreign body in stomach, initial encounter ?                       T18.108A, Unspecified foreign body in esophagus  ?                       causing other injury, initial encounter ?CPT copyright 2019 [employer] Medical Association. All rights reserved. ?The codes documented in this report are preliminary and upon coder review may  ?be revised to meet current compliance requirements. ?Eather Colas MD, MD ?06/03/2021 10:43:39 PM ?Number of Addenda: 0 ?Note Initiated On: 06/03/2021 9:41 PM ?Estimated Blood Loss:  Estimated blood loss was minimal. ?     [facility] ?

## 2021-06-03 NOTE — Anesthesia Preprocedure Evaluation (Signed)
Anesthesia Evaluation  ?Patient identified by MRN, date of birth, ID band ?Patient awake ? ? ? ?Reviewed: ?Allergy & Precautions, NPO status , Patient's Chart, lab work & pertinent test results ? ?History of Anesthesia Complications ?Negative for: history of anesthetic complications ? ?Airway ?Mallampati: III ? ?TM Distance: >3 FB ?Neck ROM: full ? ? ? Dental ? ?(+) Chipped ?  ?Pulmonary ?neg shortness of breath, former smoker,  ?  ?Pulmonary exam normal ? ? ? ? ? ? ? Cardiovascular ?Exercise Tolerance: Good ?(-) angina(-) Past MI negative cardio ROS ?Normal cardiovascular exam ? ? ?  ?Neuro/Psych ?negative neurological ROS ? negative psych ROS  ? GI/Hepatic ?negative GI ROS, Neg liver ROS,   ?Endo/Other  ?negative endocrine ROS ? Renal/GU ?  ? ?  ?Musculoskeletal ? ?(+) Arthritis ,  ? Abdominal ?  ?Peds ? Hematology ?negative hematology ROS ?(+)   ?Anesthesia Other Findings ?Food bolus ? ?Past Medical History: ?No date: Ankylosing spondylitis (HCC) ? ?Past Surgical History: ?No date: EYE SURGERY ? ? ? ? Reproductive/Obstetrics ?negative OB ROS ? ?  ? ? ? ? ? ? ? ? ? ? ? ? ? ?  ?  ? ? ? ? ? ? ? ? ?Anesthesia Physical ?Anesthesia Plan ? ?ASA: 2 and emergent ? ?Anesthesia Plan: General ETT and Rapid Sequence  ? ?Post-op Pain Management:   ? ?Induction: Intravenous ? ?PONV Risk Score and Plan: Ondansetron, Dexamethasone, Midazolam and Treatment may vary due to age or medical condition ? ?Airway Management Planned: Oral ETT and Video Laryngoscope Planned ? ?Additional Equipment:  ? ?Intra-op Plan:  ? ?Post-operative Plan: Extubation in OR ? ?Informed Consent: I have reviewed the patients History and Physical, chart, labs and discussed the procedure including the risks, benefits and alternatives for the proposed anesthesia with the patient or authorized representative who has indicated his/her understanding and acceptance.  ? ? ? ?Dental Advisory Given ? ?Plan Discussed with:  Anesthesiologist, CRNA and Surgeon ? ?Anesthesia Plan Comments: (Patient consented for risks of anesthesia including but not limited to:  ?- adverse reactions to medications ?- damage to eyes, teeth, lips or other oral mucosa ?- nerve damage due to positioning  ?- sore throat or hoarseness ?- Damage to heart, brain, nerves, lungs, other parts of body or loss of life ? ?Patient voiced understanding.)  ? ? ? ? ? ? ?Anesthesia Quick Evaluation ? ?

## 2021-06-03 NOTE — ED Notes (Signed)
Report given to Raynelle Fanning, RN with endoscopy. States she will get consent signed. Pt undressed and placed in a gown. All belongings placed in a bag and given to pt's wife. ?

## 2021-06-03 NOTE — Consult Note (Signed)
Consultation ? ?Referring Provider:    ED ?Admit date: 06/03/2021 ?Consult date: 06/03/2021         ?Reason for Consultation:    Food Impaction  ?       ? HPI:   ?Chad Short is a 36 y.o. gentleman here with food impaction. He states he has had them before but usually able to get them to pass with soda. No family history of GI issues. He tanks Humira for ankylosing spondylitis. No history of asthma. No blood thinners. Never had an EGD before. ? ? ?Past Medical History:  ?Diagnosis Date  ? Ankylosing spondylitis (HCC)   ? ? ?Past Surgical History:  ?Procedure Laterality Date  ? EYE SURGERY    ? ? ?Family History  ?Problem Relation Age of Onset  ? AAA (abdominal aortic aneurysm) Mother   ? Multiple sclerosis Father   ? Diabetes Paternal Grandmother   ? COPD Paternal Grandmother   ? Hypertension Paternal Grandmother   ? ? ?Social History  ? ?Tobacco Use  ? Smoking status: Former  ? Smokeless tobacco: Current  ?  Types: Snuff  ?Substance Use Topics  ? Alcohol use: Yes  ?  Comment: socially  ? Drug use: No  ? ? ?Prior to Admission medications   ?Medication Sig Start Date End Date Taking? Authorizing Provider  ?loratadine (CLARITIN) 10 MG tablet Take 10 mg by mouth daily.    [provider]  ?traMADol (ULTRAM) 50 MG tablet Take 1 tablet (50 mg total) by mouth every 8 (eight) hours as needed. ?Patient not taking: Reported on 04/23/2017 06/16/16   Faythe Ghee, PA-C  ? ? ?Current Facility-Administered Medications  ?Medication Dose Route Frequency Provider Last Rate Last Admin  ? ondansetron (ZOFRAN) injection 4 mg  4 mg Intravenous Once Concha Se, MD      ? ?Current Outpatient Medications  ?Medication Sig Dispense Refill  ? loratadine (CLARITIN) 10 MG tablet Take 10 mg by mouth daily.    ? traMADol (ULTRAM) 50 MG tablet Take 1 tablet (50 mg total) by mouth every 8 (eight) hours as needed. (Patient not taking: Reported on 04/23/2017) 20 tablet 0  ? ? ?Allergies as of 06/03/2021  ? (No Known Allergies)   ? ? ? ?Review of Systems:    ?All systems reviewed and negative except where noted in HPI. ? ?Review of Systems  ?Constitutional:  Negative for chills and fever.  ?Respiratory:  Negative for shortness of breath.   ?Gastrointestinal:  Positive for nausea and vomiting.  ?Musculoskeletal:  Positive for joint pain.  ?Skin:  Negative for rash.  ?Neurological:  Negative for focal weakness.  ?Psychiatric/Behavioral:  Negative for substance abuse.   ?All other systems reviewed and are negative.  ? ? Physical Exam:  ?Vital signs in last 24 hours: ?Temp:  [98.2 ?F (36.8 ?C)] 98.2 ?F (36.8 ?C) (04/03 1938) ?Pulse Rate:  [75-95] 78 (04/03 2123) ?Resp:  [17-18] 17 (04/03 2123) ?BP: (124-138)/(88-102) 124/88 (04/03 2123) ?SpO2:  [97 %-99 %] 99 % (04/03 2123) ?  ?General:   Pleasant in mild distress ?Head:  Normocephalic and atraumatic. ?Eyes:   No icterus.   Conjunctiva pink. ?Mouth: Mucosa pink moist, no lesions. ?Neck:  Supple; no masses felt ?Lungs:  No respiratory distress ?Heart:  S1S2, RRR, no MRG. No edema. ?Abdomen:   non-distended ?Msk:  MAEW x4, No clubbing or cyanosis. Strength 5/5. Symmetrical without gross deformities. ?Neurologic:  Alert and  oriented x4;  Cranial nerves II-XII intact.  ?Skin:  Warm, dry, pink without significant lesions or rashes. ?Psych:  Alert and cooperative. Normal affect. ? ?LAB RESULTS: ?No results for input(s): WBC, HGB, HCT, PLT in the last 72 hours. ?BMET ?No results for input(s): NA, K, CL, CO2, GLUCOSE, BUN, CREATININE, CALCIUM in the last 72 hours. ?LFT ?No results for input(s): PROT, ALBUMIN, AST, ALT, ALKPHOS, BILITOT, BILIDIR, IBILI in the last 72 hours. ?PT/INR ?No results for input(s): LABPROT, INR in the last 72 hours. ? ?STUDIES: ?No results found. ? ? ? ? Impression / Plan:  ? ?36 y/o gentleman here with food impaction ? ?- plan for EGD ?- further recs after EGD ? ?Merlyn Lot MD, MPH ?[facility] ?  ?

## 2021-06-03 NOTE — ED Provider Notes (Signed)
? ?[facility] ?Provider Note ? ? Event Date/Time  ? First MD Initiated Contact with Patient 06/03/21 2014   ?  (approximate) ?History  ?Swallowed Foreign Body ? ?HPI ?Braycen Burandt is a 36 y.o. male with no stated previous past medical history who presents for a foreign body sensation of the throat after he was eating steak hibachi earlier tonight.  Patient states that he has had similar symptoms in the past however it normally passes on its own.  Patient describes a sensation of tightness in his central chest that he now states feels like it is "moving up".  Patient has been unable to tolerate secretions and describes vomiting since this began at approximately 1900 tonight.  Patient denies any shortness of breath or difficulty swallowing at this time ?Physical Exam  ?Triage Vital Signs: ?ED Triage Vitals  ?Enc Vitals Group  ?   BP 06/03/21 1938 (!) 138/102  ?   Pulse Rate 06/03/21 1938 95  ?   Resp 06/03/21 1938 18  ?   Temp 06/03/21 1938 98.2 ?F (36.8 ?C)  ?   Temp Source 06/03/21 1938 Oral  ?   SpO2 06/03/21 1938 97 %  ?   Weight --   ?   Height --   ?   Head Circumference --   ?   Peak Flow --   ?   Pain Score 06/03/21 1951 2  ?   Pain Loc --   ?   Pain Edu? --   ?   Excl. in GC? --   ? ?Most recent vital signs: ?Vitals:  ? 06/03/21 2300 06/03/21 2304  ?BP:  132/78  ?Pulse: 72 68  ?Resp: 12 14  ?Temp:    ?SpO2: 99% 98%  ? ?General: Awake, oriented x4. ?CV:  Good peripheral perfusion.  ?Resp:  Normal effort.  ?Abd:  No distention.  ?Other:  Middle-aged Caucasian male sitting in bed spitting up saliva ?ED Results / Procedures / Treatments  ?Labs ?(all labs ordered are listed, but only abnormal results are displayed) ?Labs Reviewed  ?RESP PANEL BY RT-PCR (FLU A&B, COVID) [id number]  ?SURGICAL PATHOLOGY  ? ? ?PROCEDURES: ?Critical Care performed: No ?.1-3 Lead EKG Interpretation ?Performed by: Merwyn Katos, MD ?Authorized by: Merwyn Katos, MD  ? ?  Interpretation: normal   ?  ECG  rate:  67 ?  ECG rate assessment: normal   ?  Rhythm: sinus rhythm   ?  Ectopy: none   ?  Conduction: normal   ?MEDICATIONS ORDERED IN ED: ?Medications  ?glucagon (human recombinant) (GLUCAGEN) injection 1 mg (1 mg Intravenous Given 06/03/21 2025)  ? ?IMPRESSION / MDM / ASSESSMENT AND PLAN / ED COURSE  ?I reviewed the triage vital signs and the nursing notes. ?             ?               ?Differential diagnosis includes, but is not limited to, esophageal food impaction, esophageal stricture, ACS, arrhythmia ?The patient is on the cardiac monitor to evaluate for evidence of arrhythmia and/or significant heart rate changes. ?Patient a 36 year old male the presents for signs and symptoms concerning for esophageal food impaction.  Patient denies any crushing substernal chest pain, previous vomiting prior to this event, or recent fever and therefore have low suspicion for the above differential diagnosis.  Patient attempted to clear this impaction with glucagon.  After short period of observation, this bolus did not pass and therefore  I spoke to Dr. Mia Creek and gastroenterology who agrees to manually disimpact this food bolus. ? ?Dispo: To endoscopy ? ?  ?FINAL CLINICAL IMPRESSION(S) / ED DIAGNOSES  ? ?Final diagnoses:  ?Esophageal obstruction due to food impaction  ? ?Rx / DC Orders  ? ?ED Discharge Orders   ? ? None  ? ?  ? ?Note:  This document was prepared using Dragon voice recognition software and may include unintentional dictation errors. ?  ?Merwyn Katos, MD ?06/04/21 1859 ? ?

## 2021-06-03 NOTE — ED Triage Notes (Signed)
Pt presents via POV c/o steak stuck in throat. Reports has had similar hx however was able to pass in past.  ?

## 2021-06-03 NOTE — Anesthesia Procedure Notes (Signed)
Procedure Name: Intubation ?Date/Time: 06/03/2021 10:20 PM ?Performed by: Waldo Laine, CRNA ?Pre-anesthesia Checklist: Patient identified, Patient being monitored, Timeout performed, Emergency Drugs available and Suction available ?Patient Re-evaluated:Patient Re-evaluated prior to induction ?Oxygen Delivery Method: Circle system utilized ?Preoxygenation: Pre-oxygenation with 100% oxygen ?Induction Type: IV induction, Rapid sequence and Cricoid Pressure applied ?Laryngoscope Size: 3 and McGraph ?Grade View: Grade I ?Tube type: Oral ?Tube size: 7.0 mm ?Number of attempts: 1 ?Airway Equipment and Method: Stylet ?Placement Confirmation: ETT inserted through vocal cords under direct vision, positive ETCO2 and breath sounds checked- equal and bilateral ?Secured at: 21 cm ?Tube secured with: Tape ?Dental Injury: Teeth and Oropharynx as per pre-operative assessment  ? ? ? ? ?

## 2021-06-04 NOTE — Anesthesia Postprocedure Evaluation (Signed)
Anesthesia Post Note ? ?Patient: Chad Short ? ?Procedure(s) Performed: ESOPHAGOGASTRODUODENOSCOPY (EGD) ? ?Patient location during evaluation: Endoscopy ?Anesthesia Type: General ?Level of consciousness: awake and alert ?Pain management: pain level controlled ?Vital Signs Assessment: post-procedure vital signs reviewed and stable ?Respiratory status: spontaneous breathing, nonlabored ventilation, respiratory function stable and patient connected to nasal cannula oxygen ?Cardiovascular status: blood pressure returned to baseline and stable ?Postop Assessment: no apparent nausea or vomiting ?Anesthetic complications: no ? ? ?No notable events documented. ? ? ?Last Vitals:  ?Vitals:  ? 06/03/21 2300 06/03/21 2304  ?BP:  132/78  ?Pulse: 72 68  ?Resp: 12 14  ?Temp:    ?SpO2: 99% 98%  ?  ?Last Pain:  ?Vitals:  ? 06/03/21 2250  ?TempSrc:   ?PainSc: 0-No pain  ? ? ?  ?  ?  ?  ?  ?  ? ?[name] ? ? ? ? ?

## 2021-06-05 ENCOUNTER — Encounter: Payer: Self-pay | Admitting: Gastroenterology

## 2021-06-05 LAB — SURGICAL PATHOLOGY

## 2023-03-20 ENCOUNTER — Encounter: Payer: Self-pay | Admitting: Oncology

## 2023-03-20 ENCOUNTER — Inpatient Hospital Stay: Payer: BC Managed Care – PPO | Attending: Oncology | Admitting: Oncology

## 2023-03-20 ENCOUNTER — Inpatient Hospital Stay: Payer: BC Managed Care – PPO

## 2023-03-20 VITALS — BP 137/89 | HR 72 | Temp 96.6°F | Resp 18 | Ht 71.0 in | Wt 177.7 lb

## 2023-03-20 DIAGNOSIS — D7219 Other eosinophilia: Secondary | ICD-10-CM | POA: Diagnosis present

## 2023-03-20 DIAGNOSIS — Z79899 Other long term (current) drug therapy: Secondary | ICD-10-CM | POA: Insufficient documentation

## 2023-03-20 LAB — CBC WITH DIFFERENTIAL/PLATELET
Abs Immature Granulocytes: 0.02 10*3/uL (ref 0.00–0.07)
Basophils Absolute: 0 10*3/uL (ref 0.0–0.1)
Basophils Relative: 1 %
Eosinophils Absolute: 0.5 10*3/uL (ref 0.0–0.5)
Eosinophils Relative: 8 %
HCT: 41 % (ref 39.0–52.0)
Hemoglobin: 14.1 g/dL (ref 13.0–17.0)
Immature Granulocytes: 0 %
Lymphocytes Relative: 31 %
Lymphs Abs: 1.9 10*3/uL (ref 0.7–4.0)
MCH: 29.1 pg (ref 26.0–34.0)
MCHC: 34.4 g/dL (ref 30.0–36.0)
MCV: 84.5 fL (ref 80.0–100.0)
Monocytes Absolute: 0.5 10*3/uL (ref 0.1–1.0)
Monocytes Relative: 8 %
Neutro Abs: 3.2 10*3/uL (ref 1.7–7.7)
Neutrophils Relative %: 52 %
Platelets: 311 10*3/uL (ref 150–400)
RBC: 4.85 MIL/uL (ref 4.22–5.81)
RDW: 12 % (ref 11.5–15.5)
WBC: 6.1 10*3/uL (ref 4.0–10.5)
nRBC: 0 % (ref 0.0–0.2)

## 2023-03-20 LAB — VITAMIN B12: Vitamin B-12: 296 pg/mL (ref 180–914)

## 2023-03-20 NOTE — Progress Notes (Signed)
Hematology/Oncology Consult note [facility] Telephone:([phone] [phone]  Patient Care Team: Kandyce Rud, MD as PCP - General (Family Medicine)   Name of the patient: Chad Short  [id number]  1985-11-23    Reason for referral-eosinophilia   Referring physician-Dr. Larwance Sachs  Date of visit: 03/20/23   History of presenting illness- patient is a 38 year old male with prior history of ankylosing spondylitis for which he is on adalimumab for at least 10 years.  He has been referred forEosinophilia.  Most recent CBC from 03/06/2023 showed white count of 5.6, H&H of 13.9/29.2 with a platelet count of 273.  Eosinophil were mildly increased at 8% although absolute eosinophil count was 0.45 and over the last 1 year his absolute eosinophil count on 3 separate occasions has been 0.55, 0.31 and 0.45 respectively  Patient denies any symptoms of seasonal allergies cough or shortness of breath chest pain skin rash or diarrhea.  No recent travel outside the states.   ECOG PS- 0  Pain scale- 0   Review of systems- Review of Systems  Constitutional:  Negative for chills, fever, malaise/fatigue and weight loss.  HENT:  Negative for congestion, ear discharge and nosebleeds.   Eyes:  Negative for blurred vision.  Respiratory:  Negative for cough, hemoptysis, sputum production, shortness of breath and wheezing.   Cardiovascular:  Negative for chest pain, palpitations, orthopnea and claudication.  Gastrointestinal:  Negative for abdominal pain, blood in stool, constipation, diarrhea, heartburn, melena, nausea and vomiting.  Genitourinary:  Negative for dysuria, flank pain, frequency, hematuria and urgency.  Musculoskeletal:  Negative for back pain, joint pain and myalgias.  Skin:  Negative for rash.  Neurological:  Negative for dizziness, tingling, focal weakness, seizures, weakness and headaches.  Endo/Heme/Allergies:  Does not bruise/bleed easily.   Psychiatric/Behavioral:  Negative for depression and suicidal ideas. The patient does not have insomnia.     No Known Allergies  There are no active problems to display for this patient.    Past Medical History:  Diagnosis Date   Ankylosing spondylitis [facility])      Past Surgical History:  Procedure Laterality Date   ESOPHAGOGASTRODUODENOSCOPY N/A 06/03/2021   Procedure: ESOPHAGOGASTRODUODENOSCOPY (EGD);  Surgeon: Regis Bill, MD;  Location[facility] END[facility]COPY;  Service: Endoscopy;  Laterality: N/A;   EYE SURGERY      Social History   Socioeconomic History   Marital status: Married    Spouse name: Not on file   Number of children: Not on file   Years of education: Not on file   Highest education level: Not on file  Occupational History   Not on file  Tobacco Use   Smoking status: Former   Smokeless tobacco: Current    Types: Snuff  Vaping Use   Vaping status: Never Used  Substance and Sexual Activity   Alcohol use: Yes    Alcohol/week: 3.0 standard drinks of alcohol    Types: 3 Cans of beer per week    Comment: social drinker   Drug use: No   Sexual activity: Yes  Other Topics Concern   Not on file  Social History Narrative   Loves spending time with wife and kids. Has 2 girls, 1 boy, & currently expecting another boy with a due date of 04/20/23!   Social Drivers of Corporate investment banker Strain: Low Risk  (08/14/2022)   Received from [facility], [facility]   Overall Financial Resource Strain (CARDIA)    Difficulty of  Paying Living Expenses: Not very hard  Food Insecurity: No Food Insecurity (03/20/2023)   Hunger Vital Sign    Worried About Running Out of Food in the Last Year: Never true    Ran Out of Food in the Last Year: Never true  Transportation Needs: No Transportation Needs (08/14/2022)   Received from [facility], [facility]   [facility] - Transportation    In the past 12  months, has lack of transportation kept you from medical appointments or from getting medications?: No    Lack of Transportation (Non-Medical): No  Physical Activity: Not on file  Stress: Not on file  Social Connections: Not on file  Intimate Partner Violence: Not on file     Family History  Problem Relation Age of Onset   AAA (abdominal aortic aneurysm) Mother    Multiple sclerosis Father    Diabetes Paternal Grandmother    COPD Paternal Grandmother    Hypertension Paternal Grandmother      Current Outpatient Medications:    adalimumab (HUMIRA, 2 SYRINGE,) 40 MG/0.8ML prefilled syringe, Inject into the skin., Disp: , Rfl:    escitalopram (LEXAPRO) 10 MG tablet, Take 10 mg by mouth daily., Disp: , Rfl:    loratadine (CLARITIN) 10 MG tablet, Take 10 mg by mouth daily., Disp: , Rfl:    pantoprazole (PROTONIX) 40 MG tablet, Take 40 mg by mouth 2 (two) times daily., Disp: , Rfl:    traMADol (ULTRAM) 50 MG tablet, Take 1 tablet (50 mg total) by mouth every 8 (eight) hours as needed. (Patient not taking: Reported on 04/23/2017), Disp: 20 tablet, Rfl: 0   VALACYCLOVIR HCL PO, , Disp: , Rfl:    Physical exam:  Vitals:   03/20/23 1351  BP: 137/89  Pulse: 72  Resp: 18  Temp: (!) 96.6 F (35.9 C)  TempSrc: Tympanic  SpO2: 99%  Weight: 177 lb 11.2 oz (80.6 kg)  Height: 5\' 11"  (1.803 m)   Physical Exam Cardiovascular:     Rate and Rhythm: Normal rate and regular rhythm.     Heart sounds: Normal heart sounds.  Pulmonary:     Effort: Pulmonary effort is normal.     Breath sounds: Normal breath sounds.  Abdominal:     General: Bowel sounds are normal.     Palpations: Abdomen is soft.     Comments: No palpable hepatosplenomegaly  Lymphadenopathy:     Comments: No palpable cervical, supraclavicular, axillary or inguinal adenopathy    Skin:    General: Skin is warm and dry.  Neurological:     Mental Status: He is alert and oriented to person, place, and time.            Latest Ref Rng & Units 04/23/2017    9:00 AM  CMP  Glucose 65 - 99 mg/dL 92   BUN 6 - 20 mg/dL 11   Creatinine 9.14 - 1.27 mg/dL 7.82   Sodium 956 - 213 mmol/L 140   Potassium 3.5 - 5.2 mmol/L 4.5   Chloride 96 - 106 mmol/L 101   Calcium 8.7 - 10.2 mg/dL 9.5   Total Protein 6.0 - 8.5 g/dL 7.4   Total Bilirubin 0.0 - 1.2 mg/dL 0.4   Alkaline Phos 39 - 117 IU/L 74   AST 0 - 40 IU/L 23   ALT 0 - 44 IU/L 18       Latest Ref Rng & Units 04/23/2017    9:00 AM  CBC  WBC 3.4 -  10.8 x10E3/uL 4.7   Hemoglobin 13.0 - 17.7 g/dL 13.0   Hematocrit 86.5 - 51.0 % 37.6   Platelets 150 - 379 x10E3/uL 263     Assessment and plan- Patient is a 38 y.o. male referred for eosinophilia  Patient noted to have 7 to 8% eosinophils in his differential but his absolute eosinophil count on 3 separate occasions dating back to September 2023 has remained at or less than 500.  Patient does not have any systemic signs and symptoms of eosinophilia and his absolute eosinophil count at this time is normal.  Mild peripheral eosinophilia can be seen in the setting of adalimumab.  I have asked him to procure CBC results from prior to 2023 as well to corroborate this findings.  Even if this is not secondary to adalimumab overall his absolute eosinophil count is within normal range and as long as it is not persistently going up more than 500 it is not concerning.  I am checking CBC with differential B12 and tryptase levels today and I will see him for a virtual visit in 2 weeks time.  Patient does not have to stop his adalimumab at this time as it has been helping him for his back pain for over 10 years now   Thank you for this kind referral and the opportunity to participate in the care of this patient   Visit Diagnosis 1. Other eosinophilia     Dr. Owens Shark, MD, MPH [facility] at [facility] [phone] 03/20/2023

## 2023-03-22 LAB — TRYPTASE: Tryptase: 3.8 ug/L (ref 2.2–13.2)

## 2023-04-15 ENCOUNTER — Inpatient Hospital Stay: Payer: BC Managed Care – PPO | Admitting: Oncology

## 2023-04-16 ENCOUNTER — Inpatient Hospital Stay: Payer: BC Managed Care – PPO | Attending: Oncology | Admitting: Oncology

## 2023-04-16 DIAGNOSIS — D7219 Other eosinophilia: Secondary | ICD-10-CM | POA: Diagnosis not present

## 2023-04-19 ENCOUNTER — Encounter: Payer: Self-pay | Admitting: Oncology

## 2023-04-19 NOTE — Progress Notes (Signed)
 I connected with Chad Short on 04/19/23 at  9:00 AM EST by video enabled telemedicine visit and verified that I am speaking with the correct person using two identifiers.   I discussed the limitations, risks, security and privacy concerns of performing an evaluation and management service by telemedicine and the availability of in-person appointments. I also discussed with the patient that there may be a patient responsible charge related to this service. The patient expressed understanding and agreed to proceed.  Other persons participating in the visit and their role in the encounter:  none  Patient's location:  home Provider's location:  home  Chief Complaint:  discuss results of bloodwork  History of present illness:  patient is a 38 year old male with prior history of ankylosing spondylitis for which he is on adalimumab for at least 10 years.  He has been referred forEosinophilia.  Most recent CBC from 03/06/2023 showed white count of 5.6, H&H of 13.9/29.2 with a platelet count of 273.  Eosinophil were mildly increased at 8% although absolute eosinophil count was 0.45 and over the last 1 year his absolute eosinophil count on 3 separate occasions has been 0.55, 0.31 and 0.45 respectively   Results of blood work from 03/20/2023 showed B12 level of 296. Tryptase levels were normal CBC was normal with an eosinophil 8% with an absolute eosinophil count of 500  Interval history he is doing well presently and denies any complaints at this time   Review of Systems  Constitutional:  Negative for chills, fever, malaise/fatigue and weight loss.  HENT:  Negative for congestion, ear discharge and nosebleeds.   Eyes:  Negative for blurred vision.  Respiratory:  Negative for cough, hemoptysis, sputum production, shortness of breath and wheezing.   Cardiovascular:  Negative for chest pain, palpitations, orthopnea and claudication.  Gastrointestinal:  Negative for abdominal pain, blood in stool,  constipation, diarrhea, heartburn, melena, nausea and vomiting.  Genitourinary:  Negative for dysuria, flank pain, frequency, hematuria and urgency.  Musculoskeletal:  Negative for back pain, joint pain and myalgias.  Skin:  Negative for rash.  Neurological:  Negative for dizziness, tingling, focal weakness, seizures, weakness and headaches.  Endo/Heme/Allergies:  Does not bruise/bleed easily.  Psychiatric/Behavioral:  Negative for depression and suicidal ideas. The patient does not have insomnia.     No Known Allergies  Past Medical History:  Diagnosis Date   Ankylosing spondylitis [facility])     Past Surgical History:  Procedure Laterality Date   ESOPHAGOGASTRODUODENOSCOPY N/A 06/03/2021   Procedure: ESOPHAGOGASTRODUODENOSCOPY (EGD);  Surgeon: Regis Bill, MD;  Location: [facility] ENDOSCOPY;  Service: Endoscopy;  Laterality: N/A;   EYE SURGERY      Social History   Socioeconomic History   Marital status: Married    Spouse name: Not on file   Number of children: Not on file   Years of education: Not on file   Highest education level: Not on file  Occupational History   Not on file  Tobacco Use   Smoking status: Former   Smokeless tobacco: Current    Types: Snuff  Vaping Use   Vaping status: Never Used  Substance and Sexual Activity   Alcohol use: Yes    Alcohol/week: 3.0 standard drinks of alcohol    Types: 3 Cans of beer per week    Comment: social drinker   Drug use: No   Sexual activity: Yes  Other Topics Concern   Not on file  Social History Narrative   Loves spending time with wife and kids.  Has 2 girls, 1 boy, & currently expecting another boy with a due date of 04/20/23!   Social Drivers of Corporate investment banker Strain: Low Risk  (08/14/2022)   Received [facility], [facility]   Overall Financial Resource Strain (CARDIA)    Difficulty of Paying Living Expenses: Not very hard  Food Insecurity: No Food  Insecurity (03/20/2023)   Hunger Vital Sign    Worried About Running Out of Food in the Last Year: Never true    Ran Out of Food in the Last Year: Never true  Transportation Needs: No Transportation Needs (03/20/2023)   PRAPARE - Administrator, Civil Service (Medical): No    Lack of Transportation (Non-Medical): No  Physical Activity: Not on file  Stress: Not on file  Social Connections: Not on file  Intimate Partner Violence: Not At Risk (03/20/2023)   Humiliation, Afraid, Rape, and Kick questionnaire    Fear of Current or Ex-Partner: No    Emotionally Abused: No    Physically Abused: No    Sexually Abused: No    Family History  Problem Relation Age of Onset   AAA (abdominal aortic aneurysm) Mother    Multiple sclerosis Father    Diabetes Paternal Grandmother    COPD Paternal Grandmother    Hypertension Paternal Grandmother      Current Outpatient Medications:    adalimumab (HUMIRA, 2 SYRINGE,) 40 MG/0.8ML prefilled syringe, Inject into the skin., Disp: , Rfl:    escitalopram (LEXAPRO) 10 MG tablet, Take 10 mg by mouth daily., Disp: , Rfl:    loratadine (CLARITIN) 10 MG tablet, Take 10 mg by mouth daily., Disp: , Rfl:    pantoprazole (PROTONIX) 40 MG tablet, Take 40 mg by mouth 2 (two) times daily., Disp: , Rfl:    traMADol (ULTRAM) 50 MG tablet, Take 1 tablet (50 mg total) by mouth every 8 (eight) hours as needed. (Patient not taking: Reported on 04/23/2017), Disp: 20 tablet, Rfl: 0   VALACYCLOVIR HCL PO, , Disp: , Rfl:   No results found.  No images are attached to the encounter.      Latest Ref Rng & Units 04/23/2017    9:00 AM  CMP  Glucose 65 - 99 mg/dL 92   BUN 6 - 20 mg/dL 11   Creatinine 1.61 - 1.27 mg/dL 0.96   Sodium 045 - 409 mmol/L 140   Potassium 3.5 - 5.2 mmol/L 4.5   Chloride 96 - 106 mmol/L 101   Calcium 8.7 - 10.2 mg/dL 9.5   Total Protein 6.0 - 8.5 g/dL 7.4   Total Bilirubin 0.0 - 1.2 mg/dL 0.4   Alkaline Phos 39 - 117 IU/L 74   AST 0  - 40 IU/L 23   ALT 0 - 44 IU/L 18       Latest Ref Rng & Units 03/20/2023    2:32 PM  CBC  WBC 4.0 - 10.5 K/uL 6.1   Hemoglobin 13.0 - 17.0 g/dL 81.1   Hematocrit 91.4 - 52.0 % 41.0   Platelets 150 - 400 K/uL 311      Observation/objective: Appears in no acute distress over video visit today.  Breathing is nonlabored  Assessment and plan: Patient is a 38 year old male referred for eosinophilia  Patient has had mild eosinophilia with an absolute eosinophil count which ranges around 500 with an eosinophil percentage of 8%.  He has not had a consistent rising trend in his eosinophil count.  No endorgan damage.  I suspect this is secondary to adalimumab which can be monitored by his primary care doctor and rheumatology.  He does not require any further workup from my side such as bone marrow biopsy.  If there is a continued increase in his eosinophil count he can be referred to [place] in the future  Follow-up instructions: No follow-up needed  I discussed the assessment and treatment plan with the patient. The patient was provided an opportunity to ask questions and all were answered. The patient agreed with the plan and demonstrated an understanding of the instructions.   The patient was advised to call back or seek an in-person evaluation if the symptoms worsen or if the condition fails to improve as anticipated.  I provided 8 minutes of face-to-face video visit time during this encounter, and > 50% was spent counseling as documented under my assessment & plan.  Visit Diagnosis: 1. Other eosinophilia     Dr. Owens Shark, MD, MPH [facility] at [facility] Tel- [phone] 04/19/2023 9:37 AM

## 2023-06-08 NOTE — Progress Notes (Signed)
 06/09/2023 4:18 PM   Chad Short 10/24/1985 [id number]  Referring provider: Kandyce Rud, MD [phone] [address] [facility] [place],  [place] [address]  No chief complaint on file.   HPI:    PMH: Past Medical History:  Diagnosis Date   Ankylosing spondylitis [facility])     Surgical History: Past Surgical History:  Procedure Laterality Date   ESOPHAGOGASTRODUODENOSCOPY N/A 06/03/2021   Procedure: ESOPHAGOGASTRODUODENOSCOPY (EGD);  Surgeon: Regis Bill, MD;  Location: [facility] ENDOSCOPY;  Service: Endoscopy;  Laterality: N/A;   EYE SURGERY      Home Medications:  Allergies as of 06/09/2023   No Known Allergies      Medication List        Accurate as of June 08, 2023  4:18 PM. If you have any questions, ask your nurse or doctor.          escitalopram 10 MG tablet Commonly known as: LEXAPRO Take 10 mg by mouth daily.   Humira (2 Syringe) 40 MG/0.8ML prefilled syringe Generic drug: adalimumab Inject into the skin.   loratadine 10 MG tablet Commonly known as: CLARITIN Take 10 mg by mouth daily.   pantoprazole 40 MG tablet Commonly known as: PROTONIX Take 40 mg by mouth 2 (two) times daily.   traMADol 50 MG tablet Commonly known as: ULTRAM Take 1 tablet (50 mg total) by mouth every 8 (eight) hours as needed.   VALACYCLOVIR HCL PO        Allergies: No Known Allergies  Family History: Family History  Problem Relation Age of Onset   AAA (abdominal aortic aneurysm) Mother    Multiple sclerosis Father    Diabetes Paternal Grandmother    COPD Paternal Grandmother    Hypertension Paternal Grandmother     Social History:  reports that he has quit smoking. His smokeless tobacco use includes snuff. He reports current alcohol use of about 3.0 standard drinks of alcohol per week. He reports that he does not use drugs.  ROS: All other review of systems were reviewed and are negative except what is noted  above in HPI  Physical Exam: There were no vitals taken for this visit.  Constitutional:  Alert and oriented, No acute distress. HEENT: [place] AT, moist mucus membranes.  Trachea midline, no masses. Cardiovascular: No clubbing, cyanosis, or edema. Respiratory: Normal respiratory effort, no increased work of breathing. GI: No inguinal hernias GU: Normal phallus. No masses/lesions on penis, testis, scrotum. Prostate ***g smooth no nodules no induration.  Lymph: No cervical or inguinal lymphadenopathy. Skin: No rashes, bruises or suspicious lesions. Neurologic: Grossly intact, no focal deficits, moving all 4 extremities. Psychiatric: Normal mood and affect.  Laboratory Data: Lab Results  Component Value Date   WBC 6.1 03/20/2023   HGB 14.1 03/20/2023   HCT 41.0 03/20/2023   MCV 84.5 03/20/2023   PLT 311 03/20/2023    Lab Results  Component Value Date   CREATININE 1.05 04/23/2017    No results found for: "PSA"  No results found for: "TESTOSTERONE"  No results found for: "HGBA1C"  Urinalysis No results found for: "COLORURINE", "APPEARANCEUR", "LABSPEC", "PHURINE", "GLUCOSEU", "HGBUR", "BILIRUBINUR", "KETONESUR", "PROTEINUR", "UROBILINOGEN", "NITRITE", "LEUKOCYTESUR"  No results found for: "LABMICR", "WBCUA", "RBCUA", "LABEPIT", "MUCUS", "BACTERIA"  Pertinent Imaging: *** No results found for this or any previous visit.  No results found for this or any previous visit.  No results found for this or any previous visit.  No results found for this or any previous  visit.  No results found for this or any previous visit.  No results found for this or any previous visit.  No results found for this or any previous visit.  No results found for this or any previous visit.   Assessment & Plan:    There are no diagnoses linked to this encounter.  No follow-ups on file.  Chelsea Aus, MD  [facility] [place]

## 2023-06-09 ENCOUNTER — Ambulatory Visit: Admitting: Urology

## 2023-06-09 VITALS — BP 132/68 | HR 69

## 2023-06-09 DIAGNOSIS — Z3009 Encounter for other general counseling and advice on contraception: Secondary | ICD-10-CM

## 2023-06-09 LAB — URINALYSIS, ROUTINE W REFLEX MICROSCOPIC
Bilirubin, UA: NEGATIVE
Glucose, UA: NEGATIVE
Ketones, UA: NEGATIVE
Leukocytes,UA: NEGATIVE
Nitrite, UA: NEGATIVE
Protein,UA: NEGATIVE
Specific Gravity, UA: 1.02 (ref 1.005–1.030)
Urobilinogen, Ur: 0.2 mg/dL (ref 0.2–1.0)
pH, UA: 6 (ref 5.0–7.5)

## 2023-06-09 LAB — MICROSCOPIC EXAMINATION: Bacteria, UA: NONE SEEN

## 2023-06-09 NOTE — Patient Instructions (Signed)
.  churp

## 2023-07-20 NOTE — Procedures (Signed)
 The patient has reviewed information regarding the risks and complications and understands the advantages and disadvantages of elective sterilization. With full informed written and oral consent, he has elected to proceed with vasectomy. The patient was placed in the supine position, and the genitalia was sterilely prepped with betadine and draped; 1% lidocaine was used for local anesthesia of the skin and perivasal tissue in the midline. The right vas was then grasped through the skin using the non-piercing vas clamp, the skin was pierced with a single blade of the sharpened hemostat. The sharpened hemostat was then placed through this incision, and the perivasal tissue cleared from around the vas itself. The vas was then delivered through the wound and clamped with a vas clamp, a short segment of vasal tissue was cleared, and a 2-0 chromic suture was then passed beneath the vas. The chromic suture was then tied proximally, and a second one was tied distally, isolating the approximately 1 cm segment of vas. I then excised the segment of vas and cauterized the proximal and distal lumens with the eye cautery. The excess suture was then cut, and the ligated ends were covered with dartos fascia and buried, and the vas was allowed to drop back into the scrotum after observation for any bleeding. The contralateral vas was then treated in an identical fashion. Any further bleeding points were cauterized. The wound was then observed for any bleeding, the skin edges compressed, and none was noted. The patient tolerated the procedure well and without complications.   A sterile gauze dressing was applied as well as a form of scrotal supporting clothing. He was sent home with instructions regarding wound care and signs of infection, and recommended to restrict activity for 48 hours. .  He was instructed to use ice for the next 12 hours.  He was also instructed to call immediately for excessive bleeding, swelling, drainage,  discomfort, fever or chills. He was informed he should not consider himself sterile until a  semen analysis at 12 weeks has  been noted to be completely free of sperm.

## 2023-07-21 ENCOUNTER — Ambulatory Visit: Admitting: Urology

## 2023-07-21 VITALS — BP 143/87 | HR 66

## 2023-07-21 DIAGNOSIS — Z302 Encounter for sterilization: Secondary | ICD-10-CM | POA: Diagnosis not present

## 2023-07-21 NOTE — Patient Instructions (Signed)
 Vasectomy Postoperative Instructions  Please bring back a semen analysis in approximately 3 months.  These are scheduled by appointment only.  PLEASE NOTE THE LOCATION BELOW  Your semen analysis  will need to be taken to:  [address] [place], [place] [address] Phone Number: [phone] Hours of scheduling: Monday, Tuesday and Wednesday 10 a.m.- 12 p.m. or 1 p.m.- 3 p.m. [facility] will provide collection instructions at the time of scheduling your appointment   You will be given a sterile specimen cup. Please label the cup with your name, date of birth, date and time of collections.  What to Expect  - slight redness, swelling and scant drainage along the incision  - mild to moderate discomfort  - black and blue (bruising) as the tissue heals  - low grade fever  - scrotal sensitivity and/or tenderness - Edges of the incision may pull apart and heal slowly, sometimes a knot may be present which remains for several months.  This is NORMAL and all part of the healing process. - if stitches are placed, they do not need to be removed - if you have pain or discomfort immediately after the vasectomy, you may use OTC pain medication for relief , ex: tylenol.  After local anesthetic wears off an ice pack will provide additional comfort and can also prevent swelling if used  Activity  - no sexual intercourse for at lease 5 days depending on comfort  - no heavy lifting for 48-72 hours (anything over 5-10 lbs)  Wound Care  - shower only after 24 hours  - no tub baths, hot tub, or pools for at least 7 days  - ice packs for 48 hours: 30 minutes on and 30 minutes off  Problem to Report  - generalized redness  - increased pain and swelling  - fever greater than 101 F  - significant drainage or bleeding from the wound  TO DO - Ejaculations help to clear the passage of sperm, but you must use another from of birth control until you are told you may discontinue its use!! - You will be given a  specimen cup to bring back a semen sample in 3 months to check and see if its clear of sperm.  Only after the semen is sent for analysis and is reported back as clear should you use this as your primary form of birth control!

## 2023-07-21 NOTE — Progress Notes (Signed)
 The patient has reviewed information regarding the risks and complications and understands the advantages and disadvantages of elective sterilization. With full informed written and oral consent, he has elected to proceed with vasectomy. The patient was placed in the supine position, and the genitalia was sterilely prepped with betadine and draped; 1% lidocaine was used for local anesthesia of the skin and perivasal tissue in the midline. The right vas was then grasped through the skin using the non-piercing vas clamp, the skin was pierced with a single blade of the sharpened hemostat. The sharpened hemostat was then placed through this incision, and the perivasal tissue cleared from around the vas itself. The vas was then delivered through the wound and clamped with a vas clamp, a short segment of vasal tissue was cleared, and a 2-0 chromic suture was then passed beneath the vas. The chromic suture was then tied proximally, and a second one was tied distally, isolating the approximately 1 cm segment of vas. I then excised the segment of vas and cauterized the proximal and distal lumens with the eye cautery. The excess suture was then cut, and the ligated ends were covered with dartos fascia and buried, and the vas was allowed to drop back into the scrotum after observation for any bleeding. The contralateral vas was then treated in an identical fashion. Any further bleeding points were cauterized. The wound was then observed for any bleeding, the skin edges compressed, and none was noted. The patient tolerated the procedure well and without complications.   A sterile gauze dressing was applied as well as a form of scrotal supporting clothing. He was sent home with instructions regarding wound care and signs of infection, and recommended to restrict activity for 48 hours. .  He was instructed to use ice for the next 12 hours.  He was also instructed to call immediately for excessive bleeding, swelling, drainage,  discomfort, fever or chills. He was informed he should not consider himself sterile until a  semen analysis at 12 weeks has  been noted to be completely free of sperm.

## 2023-10-26 NOTE — Progress Notes (Signed)
   History of Present Illness: [facility] is here today for postvasectomy check as well as semen analysis.  He underwent vasectomy on 25 May.  He had no complications after that.  Past Medical History:  Diagnosis Date   Ankylosing spondylitis [facility])     Past Surgical History:  Procedure Laterality Date   ESOPHAGOGASTRODUODENOSCOPY N/A 06/03/2021   Procedure: ESOPHAGOGASTRODUODENOSCOPY (EGD);  Surgeon: Maryruth Ole DASEN, MD;  Location: [facility] ENDOSCOPY;  Service: Endoscopy;  Laterality: N/A;   EYE SURGERY      Home Medications:  Allergies as of 10/27/2023   No Known Allergies      Medication List        Accurate as of October 26, 2023  9:04 AM. If you have any questions, ask your nurse or doctor.          escitalopram 10 MG tablet Commonly known as: LEXAPRO Take 10 mg by mouth daily.   Humira (2 Syringe) 40 MG/0.8ML prefilled syringe Generic drug: adalimumab Inject into the skin.   loratadine 10 MG tablet Commonly known as: CLARITIN Take 10 mg by mouth daily.   pantoprazole 40 MG tablet Commonly known as: PROTONIX Take 40 mg by mouth 2 (two) times daily.   traMADol  50 MG tablet Commonly known as: ULTRAM  Take 1 tablet (50 mg total) by mouth every 8 (eight) hours as needed.   VALACYCLOVIR HCL PO        Allergies: No Known Allergies  Family History  Problem Relation Age of Onset   AAA (abdominal aortic aneurysm) Mother    Multiple sclerosis Father    Diabetes Paternal Grandmother    COPD Paternal Grandmother    Hypertension Paternal Grandmother     Social History:  reports that he has quit smoking. His smokeless tobacco use includes snuff. He reports current alcohol use of about 3.0 standard drinks of alcohol per week. He reports that he does not use drugs.  ROS: A complete review of systems was performed.  All systems are negative except for pertinent findings as noted.  Simple semen analysis performed.   Impression/Assessment:  ***  Plan:  ***

## 2023-10-27 ENCOUNTER — Ambulatory Visit (INDEPENDENT_AMBULATORY_CARE_PROVIDER_SITE_OTHER): Admitting: Urology

## 2023-10-27 VITALS — BP 138/87 | HR 67

## 2023-10-27 DIAGNOSIS — Z9852 Vasectomy status: Secondary | ICD-10-CM

## 2024-03-26 ENCOUNTER — Ambulatory Visit
Admission: EM | Admit: 2024-03-26 | Discharge: 2024-03-26 | Disposition: A | Discharge status: Home / Self Care | Attending: Family Medicine | Admitting: Family Medicine

## 2024-03-26 DIAGNOSIS — S0502XA Injury of conjunctiva and corneal abrasion without foreign body, left eye, initial encounter: Secondary | ICD-10-CM | POA: Diagnosis not present

## 2024-03-26 MED ORDER — ERYTHROMYCIN 5 MG/GM OP OINT: TOPICAL_OINTMENT | OPHTHALMIC | 0 refills | Status: AC

## 2024-03-26 NOTE — ED Provider Notes (Signed)
 " [facility]   [id number] 03/26/24 Arrival Time: 1312  ASSESSMENT & PLAN:  1. Abrasion of left cornea, initial encounter    OTC Tylenol and Advil as needed. Meds ordered this encounter  Medications   erythromycin  ophthalmic ointment    Sig: Place a 1/2 inch ribbon of ointment into the lower eyelid for the next 3-5 days.    Dispense:  3.5 g    Refill:  0   No contact lens use until better. Return if not feeling better next 24-48 hours. Declines Tdap.  Reviewed expectations re: course of current medical issues. Questions answered. Outlined signs and symptoms indicating need for more acute intervention. Patient verbalized understanding. After Visit Summary given.   SUBJECTIVE:  Chad Short is a 39 y.o. male who presents with complaint of L eye pain; thinks he may have scratched his cornea removing contact lens; this am; denies vision changes with glasses. Ibuprofen 600mg  PTA; maybe a little help. Unsure of last Td.  OBJECTIVE:  Vitals:   03/26/24 1339  BP: 138/89  Pulse: 62  Resp: 16  Temp: 97.6 F (36.4 C)  TempSrc: Oral  SpO2: 98%    General appearance: alert; no distress HEENT: [place]; AT; PERRLA; no restriction of the extraocular movements OS: with reported pain; with conjunctival injection; without drainage; without gross corneal opacities; without limbal flush; without periorbital swelling or erythema; fluorescein reveals linear approx 4 mm corneal abrasion about 6 o'clock OD: normal Skin: warm and dry Psychological: alert and cooperative; normal mood and affect   Allergies[1]  Past Medical History:  Diagnosis Date   Ankylosing spondylitis (HCC)    Social History   Socioeconomic History   Marital status: Married    Spouse name: Not on file   Number of children: Not on file   Years of education: Not on file   Highest education level: Not on file  Occupational History   Not on file  Tobacco Use   Smoking status: Former   Smokeless  tobacco: Current    Types: Snuff  Vaping Use   Vaping status: Never Used  Substance and Sexual Activity   Alcohol use: Yes    Alcohol/week: 3.0 standard drinks of alcohol    Types: 3 Cans of beer per week    Comment: social drinker   Drug use: No   Sexual activity: Yes  Other Topics Concern   Not on file  Social History Narrative   Loves spending time with wife and kids. Has 2 girls, 1 boy, & currently expecting another boy with a due date of 04/20/23!   Social Drivers of Health   Tobacco Use: High Risk (03/26/2024)   Patient History    Smoking Tobacco Use: Former    Smokeless Tobacco Use: Current    Passive Exposure: Not on file  Financial Resource Strain: Low Risk  (08/17/2023)   Received from [facility]   Overall Financial Resource Strain (CARDIA)    Difficulty of Paying Living Expenses: Not hard at all  Food Insecurity: No Food Insecurity (08/17/2023)   Received from [facility]   Epic    Within the past 12 months, you worried that your food would run out before you got the money to buy more.: Never true    Within the past 12 months, the food you bought just didn't last and you didn't have money to get more.: Never true  Transportation Needs: No Transportation Needs (08/17/2023)   Received from [facility]  PRAPARE - Transportation    In the past 12 months, has lack of transportation kept you from medical appointments or from getting medications?: No    Lack of Transportation (Non-Medical): No  Physical Activity: Not on file  Stress: Not on file  Social Connections: Not on file  Intimate Partner Violence: Not At Risk (03/20/2023)   Humiliation, Afraid, Rape, and Kick questionnaire    Fear of Current or Ex-Partner: No    Emotionally Abused: No    Physically Abused: No    Sexually Abused: No  Depression (PHQ2-9): Low Risk (03/20/2023)   Depression (PHQ2-9)    PHQ-2 Score: 0  Alcohol Screen: Not on file  Housing: Low  Risk  (08/17/2023)   Received from [facility]   Epic    In the last 12 months, was there a time when you were not able to pay the mortgage or rent on time?: No    In the past 12 months, how many times have you moved where you were living?: 1    At any time in the past 12 months, were you homeless or living in a shelter (including now)?: No  Utilities: Not At Risk (08/17/2023)   Received from [facility]   Epic    In the past 12 months has the electric, gas, oil, or water company threatened to shut off services in your home?: No  Health Literacy: Not on file   Family History  Problem Relation Age of Onset   AAA (abdominal aortic aneurysm) Mother    Multiple sclerosis Father    Diabetes Paternal Grandmother    COPD Paternal Grandmother    Hypertension Paternal Grandmother    Past Surgical History:  Procedure Laterality Date   ESOPHAGOGASTRODUODENOSCOPY N/A 06/03/2021   Procedure: ESOPHAGOGASTRODUODENOSCOPY (EGD);  Surgeon: Maryruth Ole DASEN, MD;  Location: [facility] ENDOSCOPY;  Service: Endoscopy;  Laterality: N/A;   EYE SURGERY         [1] No Known Allergies    Chad Rogue, MD 03/26/24 1427  "

## 2024-03-26 NOTE — ED Triage Notes (Signed)
 Pt reports he may have scratched his left eye due to complications with his contact lens since this morning

## 2024-04-15 ENCOUNTER — Encounter
